# Patient Record
Sex: Female | Born: 1975 | Race: White | Hispanic: No | Marital: Married | Smoking: Never smoker
Health system: Southern US, Community
[De-identification: ages and names within clinical notes are randomized; demographics above are authoritative.]

## PROBLEM LIST (undated history)

## (undated) ENCOUNTER — Inpatient Hospital Stay (HOSPITAL_COMMUNITY): Payer: Self-pay

## (undated) DIAGNOSIS — O24419 Gestational diabetes mellitus in pregnancy, unspecified control: Secondary | ICD-10-CM

## (undated) HISTORY — DX: Gestational diabetes mellitus in pregnancy, unspecified control: O24.419

---

## 2012-10-05 ENCOUNTER — Other Ambulatory Visit: Payer: Self-pay

## 2012-10-06 ENCOUNTER — Encounter (HOSPITAL_COMMUNITY): Payer: Self-pay | Admitting: Obstetrics and Gynecology

## 2012-10-08 ENCOUNTER — Other Ambulatory Visit (HOSPITAL_COMMUNITY): Payer: Self-pay | Admitting: Obstetrics and Gynecology

## 2012-10-08 DIAGNOSIS — O09529 Supervision of elderly multigravida, unspecified trimester: Secondary | ICD-10-CM

## 2012-10-08 DIAGNOSIS — O30039 Twin pregnancy, monochorionic/diamniotic, unspecified trimester: Secondary | ICD-10-CM

## 2012-10-12 ENCOUNTER — Other Ambulatory Visit: Payer: Self-pay

## 2012-10-12 ENCOUNTER — Ambulatory Visit (HOSPITAL_COMMUNITY)
Admission: RE | Admit: 2012-10-12 | Discharge: 2012-10-12 | Disposition: A | Discharge status: Home / Self Care | Payer: BC Managed Care – PPO | Source: Ambulatory Visit | Attending: Obstetrics and Gynecology | Admitting: Obstetrics and Gynecology

## 2012-10-12 ENCOUNTER — Encounter (HOSPITAL_COMMUNITY): Payer: Self-pay

## 2012-10-12 ENCOUNTER — Other Ambulatory Visit (HOSPITAL_COMMUNITY): Payer: Self-pay | Admitting: Obstetrics and Gynecology

## 2012-10-12 ENCOUNTER — Other Ambulatory Visit (HOSPITAL_COMMUNITY): Payer: Self-pay

## 2012-10-12 VITALS — BP 104/60 | HR 106 | Wt 175.0 lb

## 2012-10-12 DIAGNOSIS — O09529 Supervision of elderly multigravida, unspecified trimester: Secondary | ICD-10-CM

## 2012-10-12 DIAGNOSIS — O30039 Twin pregnancy, monochorionic/diamniotic, unspecified trimester: Secondary | ICD-10-CM

## 2012-10-12 DIAGNOSIS — O30009 Twin pregnancy, unspecified number of placenta and unspecified number of amniotic sacs, unspecified trimester: Secondary | ICD-10-CM | POA: Insufficient documentation

## 2012-10-12 NOTE — Progress Notes (Signed)
Genetic Counseling  High-Risk Gestation Note  Appointment Date:  10/12/2012 Referred By: Levi Aland, MD Date of Birth:  1975/04/22 Partner:  Reola Mosher   Pregnancy History: [id number] Estimated Date of Delivery: 04/26/13 Estimated Gestational Age: [redacted]w[redacted]d Attending: Alpha Gula, MD  Ms. [name] Green and her partner, Mr. [name] Green, were seen for genetic counseling because of a maternal age of 37 y.o.Marland Kitchen  The patient will be 37 y.o. at delivery.   They were counseled regarding maternal age and the association with risk for chromosome conditions due to nondisjunction with aging of the ova.  We reviewed chromosomes, nondisjunction, and the associated 1 in 49 risk for fetal aneuploidy at [redacted]w[redacted]d gestation related to a maternal age of 37 y.o. at delivery.  She was counseled that the risk for aneuploidy decreases as gestational age increases, accounting for those pregnancies which spontaneously abort.  We specifically discussed Down syndrome (trisomy 38), trisomies 38 and 38, and sex chromosome aneuploidies (47,XXX and 47,XXY) including the common features and prognoses of each.   We reviewed available screening options including First Screen, Quad screen, noninvasive prenatal screening (NIPS)/cell free fetal DNA (cffDNA) testing, and detailed ultrasound.  They were counseled that screening tests are used to modify a patient's a priori risk for aneuploidy, typically based on age.  This estimate provides a pregnancy specific risk assessment. We reviewed the benefits and limitations of each option.  We reviewed that the detection rates of these screens are lower in a twin gestation.  Specifically, we discussed the conditions for which each test screens, the detection rates, and false positive rates of each. They were also counseled regarding diagnostic testing via CVS and amniocentesis. We reviewed the approximate 1 in 100 risk for complications for CVS and the approximate 1 in 300-500 risk for  complications for amniocentesis, including spontaneous pregnancy loss. After consideration of all the options, they elected to proceed with NIPS ([facility]) at the time of today's visit.  Those results will be available in 8-10 days.  The couple declined diagnostic testing (CVS and amniocentesis) in the pregnancy.   They also expressed interest in pursuing a nuchal translucency ultrasound, which was performed today. The report will be documented separately. Given the presence of monochorionic/diamniotic twins, a follow-up ultrasound was recommended at 16 weeks. Additionally, the patient would like to return for a detailed ultrasound at ~18+ weeks gestation.  Follow-up appointments were made for 11/09/12 and 11/23/12. They understand that screening tests cannot rule out all birth defects or genetic syndromes. The patient was advised of this limitation and states she still does not want additional testing at this time.    Ms. [name] Green was provided with written information regarding cystic fibrosis (CF) including the carrier frequency and incidence in the Caucasian population, the availability of carrier testing and prenatal diagnosis if indicated.  In addition, we discussed that CF is routinely screened for as part of the [place] newborn screening panel.  She declined testing today.   Both family histories were reviewed and found to be contributory for cerebral palsy on the left side of the body for the father of the pregnancy. He reported that it is very mild and that he was suspected to have had a burst blood vessel or stroke in utero. Additionally, he reported a history of one seizure when he was in the 8th grade. This was attributed to scar tissue in his brain and a growth spurt at that time. He reported no additional seizures throughout his lifetime. We discussed that  cerebral palsy is not thought to have a genetic component. Thus, this would not be expected to affect the couple's chances to have a baby with  cerebral palsy, especially when a cause is suspected and the diagnosis is correct. Epilepsy occurs in approximately 1% of the population and can have many causes.  Approximately 80% of epilepsy is thought to be idiopathic while the remaining 20% is secondary to a variety of factors such as perinatal events, infections, trauma and genetic disease.  A specific diagnosis in an affected individual is necessary to accurately assess the risk for other family members to develop epilepsy.  In the absence of a known etiology, epilepsy is thought to be caused by a combination of genetic and environmental factors, called multifactorial inheritance. Recurrence risk for epilepsy is estimated to be 4% for offspring of an individual with primary idiopathic epilepsy. We discussed that given that the cause of his seizure is suspected, recurrence risk would likely be low for his offspring. Without further information regarding the provided family history, an accurate genetic risk cannot be calculated. Further genetic counseling is warranted if more information is obtained.  Ms. [name] Green denied exposure to environmental toxins or chemical agents. She denied the use of tobacco or street drugs. She reported drinking alcohol until 6 weeks of pregnancy, at which point she discovered the pregnancy. She reported no alcohol since that time. The all-or-none period was discussed, meaning exposures that occur in the first 4 weeks of gestation are typically thought to either not affect the pregnancy at all or result in a miscarriage.  Prenatal alcohol exposure can increase the risk for growth delays, small head size, heart defects, eye and facial differences, as well as behavior problems and learning disabilities. The risk of these to occur tends to increase with the amount of alcohol consumed. However, because there is no identified safe amount of alcohol in pregnancy, it is recommended to completely avoid alcohol in pregnancy. Given the  reported amount of exposure, risk for associated effects are likely low in the current pregnancy. She denied significant viral illnesses during the course of her pregnancy. Her medical and surgical histories were noncontributory.   I counseled this couple regarding the above risks and available options. The approximate face-to-face time with the genetic counselor was 40 minutes.  Quinn Plowman, MS,  Certified [employer] 10/12/2012

## 2012-10-12 NOTE — Progress Notes (Signed)
[name] Green  was seen today for an ultrasound appointment.  See full report in [facility].  Impression: MC/DA twin gestation with best dates of 12 0/7 weeks A thin dividing membrane was noted  NT x 2 were within normal limits  After counseling, the patient elected to undergo NIPS (cell free fetal DNA)  Recommendations: Recommend follow-up ultrasound examination at 16 weeks to begin screening for TTTS. Detailed anatomy at 18 weeks with follow up every 2 weeks thereafter Fetal echos at approximately 22 weeks due to MC/DA placentation.  Alpha Gula, MD

## 2012-10-15 ENCOUNTER — Other Ambulatory Visit (HOSPITAL_COMMUNITY): Payer: Self-pay

## 2012-10-15 ENCOUNTER — Encounter (HOSPITAL_COMMUNITY): Payer: Self-pay

## 2012-10-22 ENCOUNTER — Other Ambulatory Visit (HOSPITAL_COMMUNITY): Payer: Self-pay | Admitting: Maternal and Fetal Medicine

## 2012-10-22 ENCOUNTER — Telehealth (HOSPITAL_COMMUNITY): Payer: Self-pay | Admitting: MS"

## 2012-10-22 DIAGNOSIS — O30039 Twin pregnancy, monochorionic/diamniotic, unspecified trimester: Secondary | ICD-10-CM

## 2012-10-22 DIAGNOSIS — O09529 Supervision of elderly multigravida, unspecified trimester: Secondary | ICD-10-CM

## 2012-10-22 NOTE — Telephone Encounter (Signed)
Called [name] Green to discuss her cell free fetal DNA test results.  Mrs. [name] Green had [facility] testing through [facility].  Testing was offered because of advanced maternal age.   The patient was identified by name and DOB.  We reviewed that these are within normal limits, showing a less than 1 in 10,000 risk for trisomies 21, 18 and 13.  Given that the current pregnancy is a twin gestation, analysis for X and Y was not available.  We reviewed that this testing identifies > 99% of pregnancies with trisomy 21, >98% of pregnancies with trisomy 64, and approximately 80% of pregnancies with trisomy 38.  The detection rate is reportedly decreased in a twin gestation.  The false positive rate is <0.1% for all conditions. She understands that this testing does not identify all genetic conditions.  All questions were answered to her satisfaction, she was encouraged to call with additional questions or concerns.  We reviewed upcoming ultrasound appointment dates with Ms. [name] Green. The patient stated that she has an ultrasound scheduled for 8/5. Upon further review, this ultrasound was scheduled but was cancelled and rescheduled for 11/09/12. I stated that we can put the originally scheduled ultrasound back for 8/5, since the patient was not aware that this was cancelled. However, upon further review, the recommendations at the time of the patient's last visit were to do an ultrasound at [redacted] weeks gestation to assess amniotic fluid index and detailed anatomy ultrasound at 18 weeks. I explained to the patient that she will be 16 weeks on 11/09/12, and that must be why the 10/26/12 ultrasound was cancelled, given that she will be too early at that time. Ms. [name] Green agreed to cancel the 10/26/12 ultrasound and is planning to return to our office on 11/09/12 and 11/23/12 for ultrasounds.   Quinn Plowman, MS Certified Genetic Counselor 10/22/2012 4:12 PM

## 2012-10-26 ENCOUNTER — Ambulatory Visit (HOSPITAL_COMMUNITY): Payer: BC Managed Care – PPO

## 2012-11-09 ENCOUNTER — Ambulatory Visit (HOSPITAL_COMMUNITY)
Admission: RE | Admit: 2012-11-09 | Discharge: 2012-11-09 | Disposition: A | Discharge status: Home / Self Care | Payer: BC Managed Care – PPO | Source: Ambulatory Visit | Attending: Obstetrics and Gynecology | Admitting: Obstetrics and Gynecology

## 2012-11-09 DIAGNOSIS — O30039 Twin pregnancy, monochorionic/diamniotic, unspecified trimester: Secondary | ICD-10-CM

## 2012-11-09 DIAGNOSIS — O09529 Supervision of elderly multigravida, unspecified trimester: Secondary | ICD-10-CM

## 2012-11-09 DIAGNOSIS — O30009 Twin pregnancy, unspecified number of placenta and unspecified number of amniotic sacs, unspecified trimester: Secondary | ICD-10-CM | POA: Insufficient documentation

## 2012-11-23 ENCOUNTER — Encounter (HOSPITAL_COMMUNITY): Payer: Self-pay

## 2012-11-23 ENCOUNTER — Ambulatory Visit (HOSPITAL_COMMUNITY)
Admission: RE | Admit: 2012-11-23 | Discharge: 2012-11-23 | Disposition: A | Discharge status: Home / Self Care | Payer: BC Managed Care – PPO | Source: Ambulatory Visit | Attending: Obstetrics and Gynecology | Admitting: Obstetrics and Gynecology

## 2012-11-23 DIAGNOSIS — O30009 Twin pregnancy, unspecified number of placenta and unspecified number of amniotic sacs, unspecified trimester: Secondary | ICD-10-CM | POA: Insufficient documentation

## 2012-11-23 DIAGNOSIS — O30039 Twin pregnancy, monochorionic/diamniotic, unspecified trimester: Secondary | ICD-10-CM

## 2012-11-23 DIAGNOSIS — O09529 Supervision of elderly multigravida, unspecified trimester: Secondary | ICD-10-CM | POA: Insufficient documentation

## 2012-11-23 NOTE — Progress Notes (Signed)
Ms. [name] Green was seen for ultrasound appointment today.  Please see [facility] report for details.

## 2012-12-07 ENCOUNTER — Other Ambulatory Visit (HOSPITAL_COMMUNITY): Payer: Self-pay | Admitting: Obstetrics and Gynecology

## 2012-12-07 DIAGNOSIS — O30009 Twin pregnancy, unspecified number of placenta and unspecified number of amniotic sacs, unspecified trimester: Secondary | ICD-10-CM

## 2012-12-07 DIAGNOSIS — Z0489 Encounter for examination and observation for other specified reasons: Secondary | ICD-10-CM

## 2012-12-08 ENCOUNTER — Other Ambulatory Visit: Payer: Self-pay | Admitting: Family Medicine

## 2012-12-08 ENCOUNTER — Ambulatory Visit (HOSPITAL_COMMUNITY)
Admission: RE | Admit: 2012-12-08 | Discharge: 2012-12-08 | Disposition: A | Discharge status: Home / Self Care | Payer: BC Managed Care – PPO | Source: Ambulatory Visit | Attending: Obstetrics and Gynecology | Admitting: Obstetrics and Gynecology

## 2012-12-08 VITALS — BP 127/73 | HR 102 | Wt 185.0 lb

## 2012-12-08 DIAGNOSIS — O30009 Twin pregnancy, unspecified number of placenta and unspecified number of amniotic sacs, unspecified trimester: Secondary | ICD-10-CM | POA: Insufficient documentation

## 2012-12-08 DIAGNOSIS — O30039 Twin pregnancy, monochorionic/diamniotic, unspecified trimester: Secondary | ICD-10-CM | POA: Insufficient documentation

## 2012-12-08 DIAGNOSIS — Z0489 Encounter for examination and observation for other specified reasons: Secondary | ICD-10-CM

## 2012-12-08 DIAGNOSIS — O09529 Supervision of elderly multigravida, unspecified trimester: Secondary | ICD-10-CM | POA: Insufficient documentation

## 2012-12-08 NOTE — Progress Notes (Signed)
[name] Green  was seen today for an ultrasound appointment.  See full report in [facility].  Impression: MC/DA twin gestation with best dates of 20 1/7 weeks Normal amniotic fluid volume x 2 noted. Bladders visible in both twins  Twin A - Normal interval anatomy; 4CH and outflow tracts well-visualized  Twin B- Bilateral, mild renal pylectasis is again noted.  Limited views of the fetal heart obtained due to fetal position.  Recommendations: Recommend follow up in weeks for interval growth; 4 weeks for amniotic fluid assessment (TTTS screen)  Alpha Gula, MD

## 2012-12-22 ENCOUNTER — Ambulatory Visit (HOSPITAL_COMMUNITY)
Admission: RE | Admit: 2012-12-22 | Discharge: 2012-12-22 | Disposition: A | Discharge status: Home / Self Care | Payer: BC Managed Care – PPO | Source: Ambulatory Visit | Attending: Obstetrics and Gynecology | Admitting: Obstetrics and Gynecology

## 2012-12-22 DIAGNOSIS — Z0489 Encounter for examination and observation for other specified reasons: Secondary | ICD-10-CM

## 2012-12-22 DIAGNOSIS — O09529 Supervision of elderly multigravida, unspecified trimester: Secondary | ICD-10-CM | POA: Insufficient documentation

## 2012-12-22 DIAGNOSIS — O358XX0 Maternal care for other (suspected) fetal abnormality and damage, not applicable or unspecified: Secondary | ICD-10-CM | POA: Insufficient documentation

## 2012-12-22 DIAGNOSIS — O30009 Twin pregnancy, unspecified number of placenta and unspecified number of amniotic sacs, unspecified trimester: Secondary | ICD-10-CM

## 2012-12-22 DIAGNOSIS — Z363 Encounter for antenatal screening for malformations: Secondary | ICD-10-CM | POA: Insufficient documentation

## 2012-12-22 DIAGNOSIS — Z1389 Encounter for screening for other disorder: Secondary | ICD-10-CM | POA: Insufficient documentation

## 2012-12-22 NOTE — Progress Notes (Signed)
[name] Green  was seen today for an ultrasound appointment.  See full report in [facility].  Impression: MC/DA twin gestation with best dates of 22 1/7 weeks Interval growth is concordant  Twin A - Maternal right, cephalic, posterior placenta Normal interval anatomy.   Fetal growhth is appropriate (60th %tile) Normal amniotic fluid volume.  Twin B- Maternal left, cephalic, posterior placenta Mild right renal pylectasis noted (4 mm) Interval anatomy otherwise within normal limits.  Some heart views not seen (RVOT, LVOT, ductal arch) Growth is appropriate (55th %tile) Normal amniotic fluid volume  Recommendations: Follow up in 2 weeks to screen for TTTS. Recommend follow-up ultrasound examination in 4 weeks for growth. Fetal echo due to MC/DA placentation. (will schedule at next visit it not already done)  Alpha Gula, MD

## 2013-01-04 ENCOUNTER — Other Ambulatory Visit (HOSPITAL_COMMUNITY): Payer: Self-pay | Admitting: Obstetrics and Gynecology

## 2013-01-04 DIAGNOSIS — O09529 Supervision of elderly multigravida, unspecified trimester: Secondary | ICD-10-CM

## 2013-01-04 DIAGNOSIS — O358XX0 Maternal care for other (suspected) fetal abnormality and damage, not applicable or unspecified: Secondary | ICD-10-CM

## 2013-01-04 DIAGNOSIS — O30009 Twin pregnancy, unspecified number of placenta and unspecified number of amniotic sacs, unspecified trimester: Secondary | ICD-10-CM

## 2013-01-05 ENCOUNTER — Other Ambulatory Visit (HOSPITAL_COMMUNITY): Payer: Self-pay | Admitting: Obstetrics and Gynecology

## 2013-01-05 ENCOUNTER — Ambulatory Visit (HOSPITAL_COMMUNITY)
Admission: RE | Admit: 2013-01-05 | Discharge: 2013-01-05 | Disposition: A | Discharge status: Home / Self Care | Payer: BC Managed Care – PPO | Source: Ambulatory Visit | Attending: Obstetrics and Gynecology | Admitting: Obstetrics and Gynecology

## 2013-01-05 VITALS — BP 122/70 | HR 95 | Wt 186.0 lb

## 2013-01-05 DIAGNOSIS — O09529 Supervision of elderly multigravida, unspecified trimester: Secondary | ICD-10-CM | POA: Insufficient documentation

## 2013-01-05 DIAGNOSIS — O358XX0 Maternal care for other (suspected) fetal abnormality and damage, not applicable or unspecified: Secondary | ICD-10-CM

## 2013-01-05 DIAGNOSIS — O30009 Twin pregnancy, unspecified number of placenta and unspecified number of amniotic sacs, unspecified trimester: Secondary | ICD-10-CM

## 2013-01-05 NOTE — Progress Notes (Signed)
Ms. [name] Green was seen for ultrasound appointment today.  Please see [facility] report for details.

## 2013-01-16 ENCOUNTER — Inpatient Hospital Stay (HOSPITAL_COMMUNITY): Payer: BC Managed Care – PPO

## 2013-01-16 ENCOUNTER — Inpatient Hospital Stay (HOSPITAL_COMMUNITY): Payer: BC Managed Care – PPO | Admitting: Anesthesiology

## 2013-01-16 ENCOUNTER — Encounter (HOSPITAL_COMMUNITY): Payer: Self-pay | Admitting: *Deleted

## 2013-01-16 ENCOUNTER — Inpatient Hospital Stay (HOSPITAL_COMMUNITY)
Admission: AD | Admit: 2013-01-16 | Discharge: 2013-01-20 | DRG: 765 | Disposition: A | Discharge status: Home / Self Care | Payer: BC Managed Care – PPO | Source: Ambulatory Visit | Attending: Obstetrics & Gynecology | Admitting: Obstetrics & Gynecology

## 2013-01-16 ENCOUNTER — Encounter (HOSPITAL_COMMUNITY): Payer: BC Managed Care – PPO | Admitting: Anesthesiology

## 2013-01-16 ENCOUNTER — Encounter (HOSPITAL_COMMUNITY)
Admission: AD | Disposition: A | Discharge status: Home / Self Care | Payer: Self-pay | Source: Ambulatory Visit | Attending: Obstetrics & Gynecology

## 2013-01-16 DIAGNOSIS — O329XX0 Maternal care for malpresentation of fetus, unspecified, not applicable or unspecified: Secondary | ICD-10-CM

## 2013-01-16 DIAGNOSIS — O2390 Unspecified genitourinary tract infection in pregnancy, unspecified trimester: Secondary | ICD-10-CM

## 2013-01-16 DIAGNOSIS — O3090 Multiple gestation, unspecified, unspecified trimester: Principal | ICD-10-CM | POA: Diagnosis present

## 2013-01-16 DIAGNOSIS — B9689 Other specified bacterial agents as the cause of diseases classified elsewhere: Secondary | ICD-10-CM | POA: Diagnosis present

## 2013-01-16 DIAGNOSIS — N760 Acute vaginitis: Secondary | ICD-10-CM | POA: Diagnosis present

## 2013-01-16 DIAGNOSIS — A499 Bacterial infection, unspecified: Secondary | ICD-10-CM | POA: Diagnosis present

## 2013-01-16 DIAGNOSIS — O30039 Twin pregnancy, monochorionic/diamniotic, unspecified trimester: Secondary | ICD-10-CM

## 2013-01-16 DIAGNOSIS — O30009 Twin pregnancy, unspecified number of placenta and unspecified number of amniotic sacs, unspecified trimester: Secondary | ICD-10-CM | POA: Diagnosis present

## 2013-01-16 LAB — CBC
Hemoglobin: 12.2 g/dL (ref 12.0–15.0)
MCH: 33 pg (ref 26.0–34.0)
MCHC: 34.5 g/dL (ref 30.0–36.0)
MCV: 95.7 fL (ref 78.0–100.0)
Platelets: 224 10*3/uL (ref 150–400)
RBC: 3.7 MIL/uL — ABNORMAL LOW (ref 3.87–5.11)

## 2013-01-16 LAB — ABO/RH: ABO/RH(D): A POS

## 2013-01-16 LAB — RPR: RPR Ser Ql: NONREACTIVE

## 2013-01-16 LAB — TYPE AND SCREEN: Antibody Screen: NEGATIVE

## 2013-01-16 SURGERY — Surgical Case
Anesthesia: General | Wound class: Clean Contaminated

## 2013-01-16 MED ORDER — SIMETHICONE 80 MG PO CHEW
80.0000 mg | CHEWABLE_TABLET | Freq: Three times a day (TID) | ORAL | Status: DC
  Administered 2013-01-17 – 2013-01-19 (×7): 80 mg via ORAL
  Filled 2013-01-16 (×10): qty 1

## 2013-01-16 MED ORDER — MAGNESIUM SULFATE 40 G IN LACTATED RINGERS - SIMPLE
2.0000 g/h | INTRAVENOUS | Status: DC
  Filled 2013-01-16: qty 500

## 2013-01-16 MED ORDER — HYDROMORPHONE HCL PF 1 MG/ML IJ SOLN
0.2500 mg | INTRAMUSCULAR | Status: DC | PRN
  Administered 2013-01-16 (×4): 0.5 mg via INTRAVENOUS

## 2013-01-16 MED ORDER — LACTATED RINGERS IV SOLN
500.0000 mL | INTRAVENOUS | Status: DC | PRN
  Administered 2013-01-16: 999 mL via INTRAVENOUS

## 2013-01-16 MED ORDER — ZOLPIDEM TARTRATE 5 MG PO TABS: 5.0000 mg | ORAL_TABLET | Freq: Every evening | ORAL | Status: DC | PRN

## 2013-01-16 MED ORDER — DIPHENHYDRAMINE HCL 12.5 MG/5ML PO ELIX
12.5000 mg | ORAL_SOLUTION | Freq: Four times a day (QID) | ORAL | Status: DC | PRN
  Filled 2013-01-16: qty 5

## 2013-01-16 MED ORDER — OXYTOCIN 10 UNIT/ML IJ SOLN
40.0000 [IU] | INTRAVENOUS | Status: DC | PRN
  Administered 2013-01-16: 40 [IU] via INTRAVENOUS

## 2013-01-16 MED ORDER — HYDROMORPHONE HCL PF 1 MG/ML IJ SOLN
INTRAMUSCULAR | Status: AC
  Filled 2013-01-16: qty 1

## 2013-01-16 MED ORDER — BETAMETHASONE SOD PHOS & ACET 6 (3-3) MG/ML IJ SUSP
12.0000 mg | Freq: Once | INTRAMUSCULAR | Status: DC
  Filled 2013-01-16: qty 2

## 2013-01-16 MED ORDER — OXYCODONE-ACETAMINOPHEN 5-325 MG PO TABS
1.0000 | ORAL_TABLET | ORAL | Status: DC | PRN
  Administered 2013-01-17 (×3): 2 via ORAL
  Administered 2013-01-17 – 2013-01-18 (×3): 1 via ORAL
  Administered 2013-01-18: 2 via ORAL
  Administered 2013-01-18: 1 via ORAL
  Administered 2013-01-19 (×2): 2 via ORAL
  Filled 2013-01-16: qty 2
  Filled 2013-01-16: qty 1
  Filled 2013-01-16 (×3): qty 2
  Filled 2013-01-16: qty 1
  Filled 2013-01-16: qty 2
  Filled 2013-01-16: qty 1
  Filled 2013-01-16: qty 2
  Filled 2013-01-16: qty 1

## 2013-01-16 MED ORDER — SENNOSIDES-DOCUSATE SODIUM 8.6-50 MG PO TABS
2.0000 | ORAL_TABLET | ORAL | Status: DC
  Administered 2013-01-17 – 2013-01-20 (×3): 2 via ORAL
  Filled 2013-01-16 (×3): qty 2

## 2013-01-16 MED ORDER — WITCH HAZEL-GLYCERIN EX PADS: 1.0000 "application " | MEDICATED_PAD | CUTANEOUS | Status: DC | PRN

## 2013-01-16 MED ORDER — MIDAZOLAM HCL 2 MG/2ML IJ SOLN
INTRAMUSCULAR | Status: DC | PRN
  Administered 2013-01-16: 2 mg via INTRAVENOUS

## 2013-01-16 MED ORDER — LIDOCAINE HCL (PF) 1 % IJ SOLN: 30.0000 mL | INTRAMUSCULAR | Status: DC | PRN

## 2013-01-16 MED ORDER — ONDANSETRON HCL 4 MG/2ML IJ SOLN
INTRAMUSCULAR | Status: DC | PRN
  Administered 2013-01-16: 4 mg via INTRAVENOUS

## 2013-01-16 MED ORDER — IBUPROFEN 600 MG PO TABS: 600.0000 mg | ORAL_TABLET | Freq: Four times a day (QID) | ORAL | Status: DC | PRN

## 2013-01-16 MED ORDER — HYDROMORPHONE HCL PF 1 MG/ML IJ SOLN
INTRAMUSCULAR | Status: AC
  Administered 2013-01-16: 0.5 mg via INTRAVENOUS
  Filled 2013-01-16: qty 1

## 2013-01-16 MED ORDER — SUCCINYLCHOLINE CHLORIDE 20 MG/ML IJ SOLN
INTRAMUSCULAR | Status: DC | PRN
  Administered 2013-01-16: 200 mg via INTRAVENOUS

## 2013-01-16 MED ORDER — ACETAMINOPHEN 325 MG PO TABS: 650.0000 mg | ORAL_TABLET | ORAL | Status: DC | PRN

## 2013-01-16 MED ORDER — ONDANSETRON HCL 4 MG PO TABS: 4.0000 mg | ORAL_TABLET | ORAL | Status: DC | PRN

## 2013-01-16 MED ORDER — MIDAZOLAM HCL 2 MG/2ML IJ SOLN
INTRAMUSCULAR | Status: AC
  Filled 2013-01-16: qty 2

## 2013-01-16 MED ORDER — IBUPROFEN 600 MG PO TABS
600.0000 mg | ORAL_TABLET | Freq: Four times a day (QID) | ORAL | Status: DC
  Administered 2013-01-17 – 2013-01-20 (×13): 600 mg via ORAL
  Filled 2013-01-16 (×13): qty 1

## 2013-01-16 MED ORDER — ONDANSETRON HCL 4 MG/2ML IJ SOLN
4.0000 mg | Freq: Four times a day (QID) | INTRAMUSCULAR | Status: DC | PRN
  Filled 2013-01-16: qty 2

## 2013-01-16 MED ORDER — LACTATED RINGERS IV SOLN
INTRAVENOUS | Status: DC
  Administered 2013-01-16: 17:00:00 via INTRAVENOUS

## 2013-01-16 MED ORDER — HYDROMORPHONE HCL PF 1 MG/ML IJ SOLN
INTRAMUSCULAR | Status: DC | PRN
  Administered 2013-01-16: 1 mg via INTRAVENOUS

## 2013-01-16 MED ORDER — FENTANYL CITRATE 0.05 MG/ML IJ SOLN: INTRAMUSCULAR | Status: DC | PRN

## 2013-01-16 MED ORDER — NALOXONE HCL 0.4 MG/ML IJ SOLN: 0.4000 mg | INTRAMUSCULAR | Status: DC | PRN

## 2013-01-16 MED ORDER — PROPOFOL 10 MG/ML IV EMUL
INTRAVENOUS | Status: AC
  Filled 2013-01-16: qty 20

## 2013-01-16 MED ORDER — HYDROMORPHONE 0.3 MG/ML IV SOLN
INTRAVENOUS | Status: DC
  Administered 2013-01-16: 22:00:00 via INTRAVENOUS
  Administered 2013-01-17: 1.92 mg via INTRAVENOUS
  Administered 2013-01-17: 2.4 mg via INTRAVENOUS
  Administered 2013-01-17: 2.7 mg via INTRAVENOUS
  Filled 2013-01-16: qty 25

## 2013-01-16 MED ORDER — MENTHOL 3 MG MT LOZG: 1.0000 | LOZENGE | OROMUCOSAL | Status: DC | PRN

## 2013-01-16 MED ORDER — LACTATED RINGERS IV SOLN: INTRAVENOUS | Status: DC

## 2013-01-16 MED ORDER — OXYTOCIN 40 UNITS IN LACTATED RINGERS INFUSION - SIMPLE MED: 62.5000 mL/h | INTRAVENOUS | Status: AC

## 2013-01-16 MED ORDER — OXYCODONE-ACETAMINOPHEN 5-325 MG PO TABS: 1.0000 | ORAL_TABLET | ORAL | Status: DC | PRN

## 2013-01-16 MED ORDER — DIPHENHYDRAMINE HCL 50 MG/ML IJ SOLN: 12.5000 mg | Freq: Four times a day (QID) | INTRAMUSCULAR | Status: DC | PRN

## 2013-01-16 MED ORDER — SUCCINYLCHOLINE CHLORIDE 20 MG/ML IJ SOLN
INTRAMUSCULAR | Status: AC
  Filled 2013-01-16: qty 10

## 2013-01-16 MED ORDER — ONDANSETRON HCL 4 MG/2ML IJ SOLN: 4.0000 mg | Freq: Four times a day (QID) | INTRAMUSCULAR | Status: DC | PRN

## 2013-01-16 MED ORDER — TERBUTALINE SULFATE 1 MG/ML IJ SOLN
INTRAMUSCULAR | Status: AC
  Filled 2013-01-16: qty 1

## 2013-01-16 MED ORDER — MAGNESIUM SULFATE BOLUS VIA INFUSION
6.0000 g | Freq: Once | INTRAVENOUS | Status: DC
  Filled 2013-01-16: qty 500

## 2013-01-16 MED ORDER — FENTANYL CITRATE 0.05 MG/ML IJ SOLN
INTRAMUSCULAR | Status: AC
  Filled 2013-01-16: qty 5

## 2013-01-16 MED ORDER — ONDANSETRON HCL 4 MG/2ML IJ SOLN
INTRAMUSCULAR | Status: AC
  Filled 2013-01-16: qty 2

## 2013-01-16 MED ORDER — LANOLIN HYDROUS EX OINT: 1.0000 "application " | TOPICAL_OINTMENT | CUTANEOUS | Status: DC | PRN

## 2013-01-16 MED ORDER — PRENATAL MULTIVITAMIN CH
1.0000 | ORAL_TABLET | Freq: Every day | ORAL | Status: DC
  Administered 2013-01-17 – 2013-01-20 (×4): 1 via ORAL
  Filled 2013-01-16 (×4): qty 1

## 2013-01-16 MED ORDER — ONDANSETRON 4 MG PO TBDP: 4.0000 mg | ORAL_TABLET | Freq: Once | ORAL | Status: DC

## 2013-01-16 MED ORDER — OXYTOCIN 10 UNIT/ML IJ SOLN
INTRAMUSCULAR | Status: AC
  Filled 2013-01-16: qty 1

## 2013-01-16 MED ORDER — SODIUM CHLORIDE 0.9 % IV SOLN
2.0000 g | Freq: Once | INTRAVENOUS | Status: AC
  Administered 2013-01-16: 2 g via INTRAVENOUS
  Filled 2013-01-16: qty 2000

## 2013-01-16 MED ORDER — OXYTOCIN 40 UNITS IN LACTATED RINGERS INFUSION - SIMPLE MED: 62.5000 mL/h | INTRAVENOUS | Status: DC

## 2013-01-16 MED ORDER — LACTATED RINGERS IV SOLN
INTRAVENOUS | Status: DC | PRN
  Administered 2013-01-16 (×2): via INTRAVENOUS

## 2013-01-16 MED ORDER — PROPOFOL 10 MG/ML IV BOLUS
INTRAVENOUS | Status: DC | PRN
  Administered 2013-01-16: 200 mg via INTRAVENOUS

## 2013-01-16 MED ORDER — BUTORPHANOL TARTRATE 1 MG/ML IJ SOLN: 1.0000 mg | INTRAMUSCULAR | Status: DC | PRN

## 2013-01-16 MED ORDER — FLEET ENEMA 7-19 GM/118ML RE ENEM: 1.0000 | ENEMA | RECTAL | Status: DC | PRN

## 2013-01-16 MED ORDER — OXYTOCIN 10 UNIT/ML IJ SOLN
INTRAMUSCULAR | Status: AC
  Filled 2013-01-16: qty 3

## 2013-01-16 MED ORDER — FENTANYL CITRATE 0.05 MG/ML IJ SOLN
INTRAMUSCULAR | Status: DC | PRN
  Administered 2013-01-16: 125 ug via INTRAVENOUS
  Administered 2013-01-16: 75 ug via INTRAVENOUS
  Administered 2013-01-16: 50 ug via INTRAVENOUS

## 2013-01-16 MED ORDER — DIBUCAINE 1 % RE OINT: 1.0000 "application " | TOPICAL_OINTMENT | RECTAL | Status: DC | PRN

## 2013-01-16 MED ORDER — ONDANSETRON HCL 4 MG/2ML IJ SOLN
4.0000 mg | INTRAMUSCULAR | Status: DC | PRN
  Administered 2013-01-16 – 2013-01-18 (×2): 4 mg via INTRAVENOUS

## 2013-01-16 MED ORDER — TETANUS-DIPHTH-ACELL PERTUSSIS 5-2.5-18.5 LF-MCG/0.5 IM SUSP
0.5000 mL | Freq: Once | INTRAMUSCULAR | Status: AC
  Administered 2013-01-18: 0.5 mL via INTRAMUSCULAR
  Filled 2013-01-16: qty 0.5

## 2013-01-16 MED ORDER — OXYTOCIN BOLUS FROM INFUSION: 500.0000 mL | INTRAVENOUS | Status: DC

## 2013-01-16 MED ORDER — SIMETHICONE 80 MG PO CHEW
80.0000 mg | CHEWABLE_TABLET | ORAL | Status: DC
  Administered 2013-01-17 – 2013-01-20 (×3): 80 mg via ORAL
  Filled 2013-01-16 (×3): qty 1

## 2013-01-16 MED ORDER — SIMETHICONE 80 MG PO CHEW: 80.0000 mg | CHEWABLE_TABLET | ORAL | Status: DC | PRN

## 2013-01-16 MED ORDER — HYDROMORPHONE 0.3 MG/ML IV SOLN: INTRAVENOUS | Status: DC

## 2013-01-16 MED ORDER — DIPHENHYDRAMINE HCL 25 MG PO CAPS: 25.0000 mg | ORAL_CAPSULE | Freq: Four times a day (QID) | ORAL | Status: DC | PRN

## 2013-01-16 MED ORDER — SODIUM CHLORIDE 0.9 % IJ SOLN: 9.0000 mL | INTRAMUSCULAR | Status: DC | PRN

## 2013-01-16 MED ORDER — CITRIC ACID-SODIUM CITRATE 334-500 MG/5ML PO SOLN: 30.0000 mL | ORAL | Status: DC | PRN

## 2013-01-16 SURGICAL SUPPLY — 33 items
BARRIER ADHS 3X4 INTERCEED (GAUZE/BANDAGES/DRESSINGS) IMPLANT
CLAMP CORD UMBIL (MISCELLANEOUS) IMPLANT
CLOTH BEACON ORANGE TIMEOUT ST (SAFETY) ×2 IMPLANT
CONTAINER PREFILL 10% NBF 15ML (MISCELLANEOUS) IMPLANT
DRAPE LG THREE QUARTER DISP (DRAPES) ×4 IMPLANT
DRSG OPSITE POSTOP 4X10 (GAUZE/BANDAGES/DRESSINGS) ×2 IMPLANT
DURAPREP 26ML APPLICATOR (WOUND CARE) IMPLANT
ELECT REM PT RETURN 9FT ADLT (ELECTROSURGICAL) ×2
ELECTRODE REM PT RTRN 9FT ADLT (ELECTROSURGICAL) ×1 IMPLANT
GLOVE BIO SURGEON STRL SZ 6.5 (GLOVE) ×2 IMPLANT
GOWN PREVENTION PLUS XLARGE (GOWN DISPOSABLE) ×4 IMPLANT
GOWN STRL REIN XL XLG (GOWN DISPOSABLE) ×4 IMPLANT
KIT ABG SYR 3ML LUER SLIP (SYRINGE) ×4 IMPLANT
NEEDLE HYPO 25X5/8 SAFETYGLIDE (NEEDLE) ×4 IMPLANT
NEEDLE SPNL 18GX3.5 QUINCKE PK (NEEDLE) ×2 IMPLANT
NS IRRIG 1000ML POUR BTL (IV SOLUTION) ×2 IMPLANT
PACK C SECTION WH (CUSTOM PROCEDURE TRAY) ×2 IMPLANT
PAD OB MATERNITY 4.3X12.25 (PERSONAL CARE ITEMS) ×2 IMPLANT
SUT PDS AB 0 CTX 60 (SUTURE) IMPLANT
SUT VIC AB 0 CT1 27 (SUTURE)
SUT VIC AB 0 CT1 27XBRD ANBCTR (SUTURE) IMPLANT
SUT VIC AB 0 CT1 36 (SUTURE) IMPLANT
SUT VIC AB 2-0 CT1 27 (SUTURE) ×1
SUT VIC AB 2-0 CT1 TAPERPNT 27 (SUTURE) ×1 IMPLANT
SUT VIC AB 2-0 CTX 36 (SUTURE) ×4 IMPLANT
SUT VIC AB 3-0 CT1 27 (SUTURE) ×1
SUT VIC AB 3-0 CT1 TAPERPNT 27 (SUTURE) ×1 IMPLANT
SUT VIC AB 3-0 SH 27 (SUTURE)
SUT VIC AB 3-0 SH 27X BRD (SUTURE) IMPLANT
SYR 30ML LL (SYRINGE) ×2 IMPLANT
TOWEL OR 17X24 6PK STRL BLUE (TOWEL DISPOSABLE) ×2 IMPLANT
TRAY FOLEY CATH 14FR (SET/KITS/TRAYS/PACK) ×2 IMPLANT
WATER STERILE IRR 1000ML POUR (IV SOLUTION) ×2 IMPLANT

## 2013-01-16 NOTE — Transfer of Care (Signed)
Immediate Anesthesia Transfer of Care Note  Patient: [name] Green  Procedure(s) Performed: Procedure(s): CESAREAN SECTION (N/A)  Patient Location: PACU  Anesthesia Type:General  Level of Consciousness: awake and alert   Airway & Oxygen Therapy: Patient Spontanous Breathing and Patient connected to nasal cannula oxygen  Post-op Assessment: Report given to PACU RN and Post -op Vital signs reviewed and stable  Post vital signs: Reviewed and stable  Complications: No apparent anesthesia complications

## 2013-01-16 NOTE — Progress Notes (Signed)
Baby A breech.  Decision to proceed with stat C/S.

## 2013-01-16 NOTE — Op Note (Addendum)
01/16/2013  6:01 PM  PATIENT:  [name] Green  37 y.o. female  PRE-OPERATIVE DIAGNOSIS:  25 1/[redacted] weeks EGA, breech/breech/PTL/SROM  POST-OPERATIVE DIAGNOSIS: same  PROCEDURE:  LOW VERTICAL PRIMARY CESAREAN SECTION  SURGEON:  Surgeon(s) and Role:    * Allie Bossier, MD - Primary  PHYSICIAN ASSISTANT:   ASSISTANTS: Ilda Mori, MD and Rulon Abide, MD   ANESTHESIA:   general  EBL:  Total I/O In: 1000 [I.V.:1000] Out: 600 [Urine:100; Blood:500]  BLOOD ADMINISTERED:none  DRAINS: none   LOCAL MEDICATIONS USED:  NONE  FINDINGS: living female infants, both with Apgars of 5 & 7.  Intact placenta. Normal pelvic anatomy.  SPECIMEN:  Placenta and cord blood  DISPOSITION OF SPECIMEN:  PATHOLOGY  COUNTS:  There was no count done  TOURNIQUET:  * No tourniquets in log *  DICTATION: .Dragon Dictation  PLAN OF CARE: Admit to inpatient   PATIENT DISPOSITION:  PACU - hemodynamically stable.   Delay start of Pharmacological VTE agent (>24hrs) due to surgical blood loss or risk of bleeding: not applicable  I was called to the patient's room by the midwife Deidre Poe and notified that the patient's cervix was fully dilated and her amniotic sac was bulging into the vagina.I was also notified that baby A was breech.I did a vaginal exam to confirm this. I was told that Dr. Arlyce Dice was en route. The OR, NICU, and anesthesia were all notified of my decision to do a STAT cesarean section. I discussed this with the patient and her husband who agreed with the decision. En route to the OR her amniotic sac ruptured and clear fluid was noted.  In the OR, her abdomen was quickly prepped with betadine and general anesthesia was given. A Foley catheter had been placed prior to this. A transverse incision was made approximately 2 cm above the symphysis pubis. The fascia was cut in a transverse fashion and the rectus muscles were pulled to each side with traction. The peritoneum was entered with hemostats. A  bladder blade was placed and a low vertical incision was made on the moderately well-developed lower uterine segment. Amniotomy revealed clear fluid. Baby A was delivered from a breech lie and then Baby B was delivered from a breech lie. The uterus was left in situ, and the placenta was removed with traction. It was sent to pathology. The uterine cavity was cleaned with a dry sponge. A bladder blade was placed. The hysterotomy incision was closed with 2 layers of 2-0 vicryl suture in a running locking fashion, the second layer imbricating the first. Excellent hemostasis was noted. The peritoneum and rectus muscles were closed together with a 2-0 vicryl suture in a running fashion. The fascia was closed with a 0 vicryl running suture. No defects were palpable. Irrigation of the subcutaneous tissue was done with normal saline. The subcuticular tissue was closed with 3-0 vicrly. Steri-strips were placed over the incision. Her Foley catheter drained clear urine throughout. An X-ray of her abdomen was done to assure that no foreign bodies were left inside as there was not time to do a proper count. She was extubated and taken to the recovery room in stable condition.

## 2013-01-16 NOTE — Anesthesia Preprocedure Evaluation (Signed)
Anesthesia Evaluation  Patient identified by MRN, date of birth, ID band Patient awake    Reviewed: Allergy & Precautions, H&P , Patient's Chart, lab work & pertinent test results, reviewed documented beta blocker date and time   Airway Mallampati: III TM Distance: >3 FB Neck ROM: full    Dental no notable dental hx.    Pulmonary  breath sounds clear to auscultation  Pulmonary exam normal       Cardiovascular Rhythm:regular Rate:Normal     Neuro/Psych    GI/Hepatic   Endo/Other    Renal/GU      Musculoskeletal   Abdominal   Peds  Hematology   Anesthesia Other Findings Patient arriving to OR when met by Anesth team.  Pt denies allergies, +hx of GA for knee surgery, no problems.. Estimated at 175lbs.  Water broke when patient moved to bed, plan GA RSI.  Reproductive/Obstetrics                           Anesthesia Physical Anesthesia Plan  ASA: II and emergent  Anesthesia Plan: General   Post-op Pain Management:    Induction: Intravenous, Rapid sequence and Cricoid pressure planned  Airway Management Planned: Oral ETT  Additional Equipment:   Intra-op Plan:   Post-operative Plan: Extubation in OR  Informed Consent: I have reviewed the patients History and Physical, chart, labs and discussed the procedure including the risks, benefits and alternatives for the proposed anesthesia with the patient or authorized representative who has indicated his/her understanding and acceptance.   Dental Advisory Given and Dental advisory given  Plan Discussed with: CRNA and Surgeon  Anesthesia Plan Comments: (  Discussed general anesthesia, including possible nausea, instrumentation of airway, sore throat,pulmonary aspiration, etc. I asked if the were any outstanding questions, or  concerns before we proceeded. )        Anesthesia Quick Evaluation

## 2013-01-16 NOTE — H&P (Signed)
37 y.o. G3P0020  Estimated Date of Delivery: 04/26/13 admitted at 25/[redacted] weeks gestation in advanced preterm labor.  Patient went to MAU with contractions and found to be 7 cm.  Orders written for Magnesium and Steroid protocol in the hope that labor could be arrested for 24 - 48 hours.  She progressed rapidly, however, and because baby A was Breech she was brought to the OR by the in house Ob, Dr. Nicholaus Bloom, for a STAT cesarean delivery.  Prenatal Transfer Tool  Maternal Diabetes: Too early for screen Genetic Screening: Declined Maternal Ultrasounds/Referrals: Mono/Di twins.  Referred to [facility] for anatomy scans and follow up. Fetal Ultrasounds or other Referrals:  None Maternal Substance Abuse:  No Significant Maternal Medications:  None Significant Maternal Lab Results: None Other Significant Pregnancy Complications:  Preterm labor and delivery.  HIV and Hep B screens pending.  Afebrile, VSS Heart and Lungs: No active disease Abdomen: soft, gravid, EFW AGA. Cervical exam:  7 cm on admission to MAU.  Complete at 1730.  Impression: Patient admitted in advanced preterm labor and delivered.   Plan:  Patient delivered by STAT C/S due to Breech/Breech presentation at complete dilatation.

## 2013-01-16 NOTE — OR Nursing (Signed)
1752 XRAY returned page, XRAY tech was made aware abdominal scan needed as prior incision count was not completed due to STAT C-Section.XRAY Tech was given the information that patient was under general anesthesia and physician was closing fascia. XRAY tech informed me she did not see an order. Order was entered, no return call from [employer], xray tech arrives at [employer]. At 1824 no answer from results of xray, paged xray tech and she stated she would contact the radiologist, I stressed the importance as patient was under general anesthesia and order was entered as STAT. At [facility] radiologist Dr Areatha Keas returns call with negative results. Dr Marice Potter was contacted by phone with results.

## 2013-01-16 NOTE — Anesthesia Postprocedure Evaluation (Signed)
  Anesthesia Post-op Note  Patient: [name] Green  Procedure(s) Performed: Procedure(s) with comments: PRIMARY CESAREAN SECTION (N/A) - Classical incision Patient is awake and responsive. Pain and nausea are reasonably well controlled. Vital signs are stable and clinically acceptable. Oxygen saturation is clinically acceptable. There are no apparent anesthetic complications at this time. Patient is ready for discharge.

## 2013-01-16 NOTE — Progress Notes (Signed)
Notified ultrasonographer thinks babies are breech.

## 2013-01-17 ENCOUNTER — Encounter (HOSPITAL_COMMUNITY): Payer: Self-pay | Admitting: *Deleted

## 2013-01-17 LAB — CBC
HCT: 29 % — ABNORMAL LOW (ref 36.0–46.0)
MCH: 33.1 pg (ref 26.0–34.0)
MCHC: 33.8 g/dL (ref 30.0–36.0)
MCV: 98 fL (ref 78.0–100.0)
Platelets: 167 10*3/uL (ref 150–400)
RBC: 2.96 MIL/uL — ABNORMAL LOW (ref 3.87–5.11)
RDW: 13.2 % (ref 11.5–15.5)
WBC: 13.3 10*3/uL — ABNORMAL HIGH (ref 4.0–10.5)

## 2013-01-17 LAB — HIV ANTIBODY (ROUTINE TESTING W REFLEX): HIV: NONREACTIVE

## 2013-01-17 MED ORDER — METRONIDAZOLE 500 MG PO TABS
500.0000 mg | ORAL_TABLET | Freq: Two times a day (BID) | ORAL | Status: DC
  Administered 2013-01-17 – 2013-01-19 (×4): 500 mg via ORAL
  Filled 2013-01-17 (×9): qty 1

## 2013-01-17 NOTE — Lactation Note (Signed)
This note was copied from the chart of [name] A Caci Green. Lactation Consultation Note    Follow up consult with this mom of twins, 25 6/[redacted] weeks gestation , now 18 hours post partum. I reviewed the use of a DEP and pumping frequency and duration with mom, and how to hand express.I reviewed the NICU booklet with mom. Mom was able to express 1-2 mls of colostrum, which made her and dad very pleased. Mom will rent a DEP, paper work given to mom.  Mom knows to call for questions/concerns.  Patient Name: [name] Green Today's Date: 01/17/2013 Reason for consult: Follow-up assessment;NICU baby;Multiple gestation   Maternal Data Formula Feeding for Exclusion: No (babies in NICU) Infant to breast within first hour of birth: No Breastfeeding delayed due to:: Infant status Has patient been taught Hand Expression?: Yes Does the patient have breastfeeding experience prior to this delivery?: No  Feeding    LATCH Score/Interventions                      Lactation Tools Discussed/Used Tools: Pump WIC Program: No (mom will rent a DEP and order one from her insurance) Initiated by:: bedside rn , at 8 hours post partum Date initiated:: 01/17/13   Consult Status Consult Status: Follow-up Date: 01/18/13 Follow-up type: In-patient    [name] Green 01/17/2013, 1:32 PM    

## 2013-01-17 NOTE — Plan of Care (Signed)
Problem: Phase I Progression Outcomes Goal: OOB as tolerated unless otherwise ordered Outcome: Progressing Walked to door and tolerated well

## 2013-01-17 NOTE — Progress Notes (Signed)
Post Op Day 1 Subjective: no complaints  Objective: Blood pressure 99/58, pulse 95, temperature 97.8 F (36.6 C), temperature source Oral, resp. rate 20, last menstrual period 07/20/2012, SpO2 97.00%, pumping breast milk.  Physical Exam:  General: alert Lochia: appropriate Uterine Fundus: firm Incision: no significant drainage   Recent Labs  01/16/13 1715 01/17/13 0540  HGB 12.2 9.8*  HCT 35.4* 29.0*    Assessment/Plan: Doing well.  Will continue observation.  Patient had symptomatic BV in office and I have ordered Flagyl to complete treatment.  Babies doing well in NICU.    LOS: 1 day   Ester Mabe D 01/17/2013, 9:49 AM

## 2013-01-17 NOTE — Plan of Care (Signed)
Problem: Consults Goal: Postpartum Patient Education (See Patient Education module for education specifics.) Outcome: Progressing Plan of care,PCA use and equipment discussed with patient and husband  Problem: Phase I Progression Outcomes Goal: Pain controlled with appropriate interventions Outcome: Completed/Met Date Met:  01/17/13 Good pain control on PCA Dilaudid Goal: Voiding adequately Outcome: Not Met (add Reason) Foley to s/d amber urine Goal: IS, TCDB as ordered Outcome: Completed/Met Date Met:  01/17/13 Patient can get I/S up to 2500 Goal: Initial discharge plan identified Outcome: Completed/Met Date Met:  01/17/13 VSS Pain control PP Checks WNL Bleeding WNL Understands self care

## 2013-01-17 NOTE — Progress Notes (Signed)
Ur chart review completed.  

## 2013-01-17 NOTE — Anesthesia Postprocedure Evaluation (Signed)
  Anesthesia Post-op Note  Patient: [name] Green  Procedure(s) Performed: Procedure(s) with comments: PRIMARY CESAREAN SECTION (N/A) - Classical incision  Patient Location: [facility] Unit  Anesthesia Type:General  Level of Consciousness: awake, alert  and oriented  Airway and Oxygen Therapy: Patient Spontanous Breathing and Patient connected to nasal cannula oxygen  Post-op Pain: none  Post-op Assessment: Post-op Vital signs reviewed, Patient's Cardiovascular Status Stable, No headache, No backache, No residual numbness and No residual motor weakness  Post-op Vital Signs: Reviewed and stable  Complications: No apparent anesthesia complications

## 2013-01-17 NOTE — Progress Notes (Signed)
I visited with pt while making rounds on the unit.  She was anxious about her babies' progress and about her ability to pump.  She is still somewhat processing all that has happened so quickly and she is also dealing with her own recovery.  We will continue to follow for support, but please also page as needs arise.  [employer]  Pager, [phone] 2:24 PM

## 2013-01-18 MED ORDER — INFLUENZA VAC SPLIT QUAD 0.5 ML IM SUSP
0.5000 mL | INTRAMUSCULAR | Status: AC
  Administered 2013-01-20: 0.5 mL via INTRAMUSCULAR
  Filled 2013-01-18: qty 0.5

## 2013-01-18 NOTE — Progress Notes (Signed)
  Patient is eating, ambulating, voiding.  Pain control is good.  Filed Vitals:   01/17/13 1804 01/17/13 2230 01/18/13 0024 01/18/13 0540  BP: 113/57 102/71  109/69  Pulse: 97 95  81  Temp: 97.6 F (36.4 C) 98.4 F (36.9 C)  98.1 F (36.7 C)  TempSrc: Oral Oral  Oral  Resp: 20 18  16   Height:   5\' 6"  (1.676 m)   Weight:   84.369 kg (186 lb)   SpO2: 100% 100%  98%    lungs:   clear to auscultation cor:    RRR Abdomen:  soft, appropriate tenderness, incisions intact and without erythema or exudate ex:    no cords   Lab Results  Component Value Date   WBC 13.3* 01/17/2013   HGB 9.8* 01/17/2013   HCT 29.0* 01/17/2013   MCV 98.0 01/17/2013   PLT 167 01/17/2013    --/--/A POS, A POS (10/26 1715)/RI  A/P    Post operative day 1.  Routine post op and postpartum care.  Expect d/c in two days.  Percocet for pain control.

## 2013-01-18 NOTE — Lactation Note (Signed)
This note was copied from the chart of [name] A Wynelle Cleveland. Lactation Consultation Note  Mom states she is pumping and hand expressing, is getting a little more volume ( drops at this time).  Mom and dad had numerous questions regarding lactation and breast feeding, as they had been planning to attend the breast feeding class on December 9, and the babies were born before they were able to attend class. Extensive teaching and discussion regarding lactation; all questions answered.  Inst mom to pump at least 8 times per day, 15 to 20 minutes, followed by hand expression for 3 to 4 minutes.  Pump rental packet given, will check on later. Mom states she has a friend with a 50 month old who is currently breast feeding, and questions using that mom's milk for her babies in case she does not have enough for them. Discussed human donor milk risk and benefits; signed consent in chart.  Enc mom to call if she has any concerns.   Patient Name: [name] Green Today's Date: 01/18/2013     Maternal Data    Feeding    LATCH Score/Interventions                      Lactation Tools Discussed/Used     Consult Status      Lenard Forth 01/18/2013, 10:35 AM

## 2013-01-19 ENCOUNTER — Ambulatory Visit (HOSPITAL_COMMUNITY): Payer: BC Managed Care – PPO

## 2013-01-19 ENCOUNTER — Encounter (HOSPITAL_COMMUNITY)
Admit: 2013-01-19 | Discharge: 2013-01-19 | Disposition: A | Discharge status: Home / Self Care | Payer: BC Managed Care – PPO

## 2013-01-19 NOTE — Progress Notes (Signed)
Pt currently down in NICU

## 2013-01-19 NOTE — Lactation Note (Signed)
This note was copied from the chart of [name] A Wynelle Cleveland. Lactation Consultation Note Follow up consultation. Reviewed pumping, hand expression, milk storage and transport, pump rental.  Mom states she is starting to get more volume with pumping.  Pump rental paper work received, rental pump provided with instructions for use. Questions answered. Reassured mom that lactation support is available to her; lactation office phone number provided; will follow up at NICU bedside. Enc mom to call the lactation office if she has any questions or concerns.   Patient Name: [name] Green Today's Date: 01/19/2013     Maternal Data    Feeding    LATCH Score/Interventions                      Lactation Tools Discussed/Used     Consult Status      Lenard Forth 01/19/2013, 11:44 AM

## 2013-01-20 MED ORDER — OXYCODONE-ACETAMINOPHEN 5-325 MG PO TABS: 1.0000 | ORAL_TABLET | ORAL | Status: DC | PRN

## 2013-01-20 NOTE — Progress Notes (Signed)
Pt. Is discharged in the care of [facility]. Downstairs per ambulatory. Spirits are good. Infants to remain in [facility]. Abdominal incisions clean and dry. Homeycomb dressing is clean and dry.

## 2013-01-20 NOTE — Progress Notes (Signed)
  Patient is eating, ambulating, voiding.  Pain control is fair.  Pt trying to pump for breast milk- babies in NICU.  Filed Vitals:   01/19/13 1200 01/19/13 1732 01/19/13 2158 01/20/13 0609  BP: 125/78 130/76 119/85 122/76  Pulse: 91 85 99 92  Temp:  97.5 F (36.4 C) 98.1 F (36.7 C) 98 F (36.7 C)  TempSrc:  Oral Oral Oral  Resp: 16 18 18 18   Height:      Weight:      SpO2: 98% 100% 100% 100%    lungs:   clear to auscultation cor:    RRR Abdomen:  soft, appropriate tenderness, incisions intact and without erythema or exudate ex:    no cords   Lab Results  Component Value Date   WBC 13.3* 01/17/2013   HGB 9.8* 01/17/2013   HCT 29.0* 01/17/2013   MCV 98.0 01/17/2013   PLT 167 01/17/2013    --/--/A POS, A POS (10/26 1715)/RI  A/P    Post operative day 3.  Routine post op and postpartum care.  Pt desires to stay one more day for pain control and lactation consult.  Percocet for pain control.

## 2013-01-20 NOTE — Discharge Summary (Signed)
Obstetric Discharge Summary Reason for Admission: onset of labor Prenatal Procedures: ultrasound Intrapartum Procedures: cesarean: low cervical, transverse Postpartum Procedures: none Complications-Operative and Postpartum: none Hemoglobin  Date Value Range Status  01/17/2013 9.8* 12.0 - 15.0 g/dL Final     REPEATED TO VERIFY     DELTA CHECK NOTED     HCT  Date Value Range Status  01/17/2013 29.0* 36.0 - 46.0 % Final   Hospital course: 37 y.o. G3P0020 Estimated Date of Delivery: 04/26/13 admitted at 25/[redacted] weeks gestation in advanced preterm labor. Patient went to MAU with contractions and found to be 7 cm. Orders written for Magnesium and Steroid protocol in the hope that labor could be arrested for 24 - 48 hours. She progressed rapidly, however, and because baby A was Breech she was brought to the OR by the in house Ob, Dr. Nicholaus Bloom, for a STAT cesarean delivery.  Pt's postop course was uncomplicated.  Twins are doing well in the NICU.   Discharge Diagnoses: Premature labor and delivery of mono/di twins  Discharge Information: Date: 01/20/2013 Activity: pelvic rest Diet: routine Medications: Iron and Percocet Condition: stable Instructions: refer to practice specific booklet Discharge to: home Follow-up Information   Follow up with Mickel Baas, MD In 4 weeks.   Specialty:  Obstetrics and Gynecology   Contact information:   [address] [place] [address] [phone]       Newborn Data:   Mirta, Mally [[id number]]  Live born female  Birth Weight: 1 lb 15.8 oz (900 g) APGAR: 5, 7   Brunetta, Newingham [[id number]]  Live born female  Birth Weight: 1 lb 14.7 oz (870 g) APGAR: 5, 7  Home with twins in NICU.  Terryann Verbeek A 01/20/2013, 8:06 AM

## 2013-01-21 NOTE — Progress Notes (Signed)
Clinical Social Work Department PSYCHOSOCIAL ASSESSMENT - MATERNAL/CHILD 01/20/2013  Patient:  [name],[name]  Account Number:  [id number]  Admit Date:  01/16/2013  Childs Name:   [name] [name]  B-[name] [name]    Clinical Social Worker:  Malajah Oceguera, LCSW   Date/Time:  01/20/2013 10:00 AM  Date Referred:  01/20/2013   Referral source  NICU     Referred reason  NICU   Other referral source:    I:  FAMILY / HOME ENVIRONMENT Child's legal guardian:  PARENT  Guardian - Name Guardian - Age Guardian - Address  [name] [name] 37 [address] [place], [place] [address]  [name] [name]  same   Other household support members/support persons Other support:   MOB reports having a good support system.  Her parents are currently here from [place], but will be traveling home today. Her brother is also visiting for a period of time.  She states they have a great support network of friends in the area.    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   Financial resources:  Private Insurance If Medicaid - County:    School / Grade:   Maternity Care Coordinator / Child Services Coordination / Early Interventions:  Cultural issues impacting care:   None stated    III  STRENGTHS Strengths  Adequate Resources  Compliance with medical plan  Understanding of illness   Strength comment:    IV  RISK FACTORS AND CURRENT PROBLEMS Current Problem:  None   Risk Factor & Current Problem Patient Issue Family Issue Risk Factor / Current Problem Comment   N N     V  SOCIAL WORK ASSESSMENT  CSW met with MOB and MGM in MOB's third floor room/305 to introduce myself and complete assessment due to premature birth of twins and admissions to NICU.  MOB and MGM were very pleasant, welcoming and receptive to CSW's visit.  She states she is taking  things one day at a time and has a great support system.  She seems be coping well and understanding of how critical the situation with her daughters (especially baby A) is at this time.  She and her mother report being very appreciative and impressed with the support they have been given by hospital staff so far.  CSW informed MOB of ongoing support by NICU CSW as well as babies' eligibility for SSI.  She is interested in applying so CSW will assist.  We talked at length about PPD signs and symptoms and how to monitor/process emotions given this difficult situation.  CSW informed MOB of babies' eligibility for SSI benefits and MOB was grateful for the information and assistance in completing the paperwork.  CSW will complete and obtain signatures at a later time.  CSW gave contact information and encouraged family to call for support/assistance at any time.  MOB and MGM were very appreciative and thanked CSW for the visit.   VI SOCIAL WORK PLAN Social Work Plan  Psychosocial Support/Ongoing Assessment of Needs   Type of pt/family education:   PPD signs and symptoms  SSI eligibility for both babies  ongoing support offered by NICU CSW   If child protective services report - county:   If child protective services report - date:   Information/referral to community resources comment:   SSI   Other social work plan:   

## 2013-02-19 ENCOUNTER — Encounter (HOSPITAL_COMMUNITY)
Admission: RE | Admit: 2013-02-19 | Discharge: 2013-02-19 | Disposition: A | Discharge status: Home / Self Care | Payer: BC Managed Care – PPO | Source: Ambulatory Visit | Attending: Obstetrics and Gynecology | Admitting: Obstetrics and Gynecology

## 2013-02-19 DIAGNOSIS — O923 Agalactia: Secondary | ICD-10-CM | POA: Insufficient documentation

## 2013-04-06 ENCOUNTER — Telehealth (HOSPITAL_COMMUNITY): Payer: Self-pay | Admitting: Family Medicine

## 2013-04-06 ENCOUNTER — Emergency Department (HOSPITAL_COMMUNITY)
Admission: EM | Admit: 2013-04-06 | Discharge: 2013-04-06 | Disposition: A | Discharge status: Home / Self Care | Payer: BC Managed Care – PPO | Source: Home / Self Care | Attending: Family Medicine | Admitting: Family Medicine

## 2013-04-06 ENCOUNTER — Encounter (HOSPITAL_COMMUNITY): Payer: Self-pay | Admitting: Emergency Medicine

## 2013-04-06 DIAGNOSIS — N61 Mastitis without abscess: Secondary | ICD-10-CM

## 2013-04-06 DIAGNOSIS — R509 Fever, unspecified: Secondary | ICD-10-CM

## 2013-04-06 LAB — INFLUENZA PANEL BY PCR (TYPE A & B)
H1N1 flu by pcr: NOT DETECTED
INFLBPCR: NEGATIVE
Influenza A By PCR: NEGATIVE

## 2013-04-06 MED ORDER — CEPHALEXIN 500 MG PO CAPS: 500.0000 mg | ORAL_CAPSULE | Freq: Four times a day (QID) | ORAL | Status: DC

## 2013-04-06 NOTE — ED Notes (Signed)
Patient's influenza test were negative. Call patient with results plan to continue treatment for mastitis  [name] BongEvan S Timothea Bodenheimer, MD 04/06/13 [phone]

## 2013-04-06 NOTE — ED Notes (Signed)
Reports fever, chills. Concern for mastitis vs flu. [facility] ,and has been advised to be checked

## 2013-04-06 NOTE — ED Provider Notes (Signed)
[name] Green is a 38 y.o. female who presents to Urgent Care today for fevers chills body aches and right breast pain. Patient is a mother of 2 very premature infants [facility]. They were born at 3225 weeks and are currently 3180 days old. Neither are currently intubated. However over the last day she has developed the above symptoms in the NICU staff is concerned about the possibility of influenza. She is here today for flu testing.  Additionally she notes right breast tenderness firmness and pain. She is concerned that she may have mastitis.   History reviewed. No pertinent past medical history. History  Substance Use Topics  . Smoking status: Never Smoker   . Smokeless tobacco: Not on file  . Alcohol Use: Not on file   ROS as above Medications: No current facility-administered medications for this encounter.   Current Outpatient Prescriptions  Medication Sig Dispense Refill  . cephALEXin (KEFLEX) 500 MG capsule Take 1 capsule (500 mg total) by mouth 4 (four) times daily.  40 capsule  0  . oxyCODONE-acetaminophen (ROXICET) 5-325 MG per tablet Take 1 tablet by mouth every 4 (four) hours as needed for pain.  30 tablet  0  . Prenatal Vit-Fe Fumarate-FA (PRENATAL MULTIVITAMIN) TABS tablet Take 1 tablet by mouth daily at 12 noon.        Exam:  BP 108/65  Pulse 98  Temp(Src) 98.6 F (37 C) (Oral)  Resp 18  SpO2 96% Gen: Well NAD HEENT: EOMI,  MMM no nasal inflammation.  Lungs: Normal work of breathing. CTABL Heart: RRR no MRG Breast: Right breast erythematous indurated and tender on the inferior surface. Left is normal Abd: NABS, Soft. NT, ND Exts: Brisk capillary refill, warm and well perfused.    Assessment and Plan: 38 y.o. female with mastitis is the most likely explanation of both her systemic symptoms and her breast pain. Plan to treat with Keflex. Additionally I am also concerned about the possibility of influenza as this will  significantly impact her children safety. We'll perform a nasopharyngeal swab for influenza PCR testing. I discussed with the lab and they will call me with test results in approximately 3 hours. I have called infection control and will call the patient and [facility] if the influenza test is positive.  Discussed warning signs or symptoms. Please see discharge instructions. Patient expresses understanding.    Rodolph BongEvan S Corey, MD 04/06/13 [phone]

## 2013-04-06 NOTE — Discharge Instructions (Signed)
Thank you for coming in today. Start taking Keflex 4 times daily for 10 days. I will call you with your flu results this evening. If you do not hear from me by 8:00 please call [phone] and ask to speak with Dr. Denyse Amass.  Breastfeeding and Mastitis Mastitis is inflammation of the breast tissue. It can occur in women who are breastfeeding. This can make breastfeeding painful. Mastitis will sometimes go away on its own. Your health care provider will help determine if treatment is needed. CAUSES Mastitis is often associated with a blocked milk (lactiferous) duct. This can happen when too much milk builds up in the breast. Causes of excess milk in the breast can include:  Poor latch-on. If your baby is not latched onto the breast properly, she or he may not empty your breast completely while breastfeeding.  Allowing too much time to pass between feedings.  Wearing a bra or other clothing that is too tight. This puts extra pressure on the lactiferous ducts so milk does not flow through them as it should. Mastitis can also be caused by a bacterial infection. Bacteria may enter the breast tissue through cuts or openings in the skin. In women who are breastfeeding, this may occur because of cracked or irritated skin. Cracks in the skin are often caused when your baby does not latch on properly to the breast. SIGNS AND SYMPTOMS  Swelling, redness, tenderness, and pain in an area of the breast.  Swelling of the glands under the arm on the same side.  Fever may or may not accompany mastitis. If an infection is allowed to progress, a collection of pus (abscess) may develop. DIAGNOSIS  Your health care provider can usually diagnose mastitis based on your symptoms and a physical exam. Tests may be done to help confirm the diagnosis. These may include:  Removal of pus from the breast by applying pressure to the area. This pus can be examined in the lab to determine which bacteria are present. If an  abscess has developed, the fluid in the abscess can be removed with a needle. This can also be used to confirm the diagnosis and determine the bacteria present. In most cases, pus will not be present.  Blood tests to determine if your body is fighting a bacterial infection.  Mammogram or ultrasound tests to rule out other problems or diseases. TREATMENT  Mastitis that occurs with breastfeeding will sometimes go away on its own. Your health care provider may choose to wait 24 hours after first seeing you to decide whether a prescription medicine is needed. If your symptoms are worse after 24 hours, your health care provider will likely prescribe an antibiotic to treat the mastitis. He or she will determine which bacteria are most likely causing the infection and will then select an appropriate antibiotic. This is sometimes changed based on the results of tests performed to identify the bacteria, or if there is no response to the antibiotic selected. Antibiotics are usually given by mouth. You may also be given medicine for pain. HOME CARE INSTRUCTIONS  Only take over-the-counter or prescription medicines for pain, fever, or discomfort as directed by your health care provider.  If your health care provider prescribed an antibiotic, take the medicine as directed. Make sure you finish it even if you start to feel better.  Do not wear a tight or underwire bra. Wear a soft, supportive bra.  Increase your fluid intake, especially if you have a fever.  Continue to empty the breast.  Your health care provider can tell you whether this milk is safe for your infant or needs to be thrown out. You may be told to stop nursing until your health care provider thinks it is safe for your baby. Use a breast pump if you are advised to stop nursing.  Keep your nipples clean and dry.  Empty the first breast completely before going to the other breast. If your baby is not emptying your breasts completely for some  reason, use a breast pump to empty your breasts.  If you go back to work, pump your breasts while at work to stay in time with your nursing schedule.  Avoid allowing your breasts to become overly filled with milk (engorged). SEEK MEDICAL CARE IF:  You have pus-like discharge from the breast.  Your symptoms do not improve with the treatment prescribed by your health care provider within 2 days. SEEK IMMEDIATE MEDICAL CARE IF:  Your pain and swelling are getting worse.  You have pain that is not controlled with medicine.  You have a red line extending from the breast toward your armpit.  You have a fever or persistent symptoms for more than 2 3 days.  You have a fever and your symptoms suddenly get worse. MAKE SURE YOU:   Understand these instructions.  Will watch your condition.  Will get help right away if you are not doing well or get worse. Document Released: 07/05/2004 Document Revised: 11/10/2012 Document Reviewed: 10/14/2012 [facility] Patient Information 2014 [place].

## 2013-04-06 NOTE — ED Notes (Signed)
Sur name of daughters : eva & mila hoyne

## 2013-07-11 ENCOUNTER — Other Ambulatory Visit: Payer: Self-pay | Admitting: Obstetrics and Gynecology

## 2013-08-27 ENCOUNTER — Encounter (HOSPITAL_COMMUNITY): Payer: Self-pay | Admitting: *Deleted

## 2013-08-27 ENCOUNTER — Inpatient Hospital Stay (HOSPITAL_COMMUNITY)
Admission: AD | Admit: 2013-08-27 | Discharge: 2013-08-27 | Disposition: A | Discharge status: Home / Self Care | Payer: BC Managed Care – PPO | Source: Ambulatory Visit | Attending: Obstetrics & Gynecology | Admitting: Obstetrics & Gynecology

## 2013-08-27 ENCOUNTER — Inpatient Hospital Stay (HOSPITAL_COMMUNITY): Payer: BC Managed Care – PPO

## 2013-08-27 DIAGNOSIS — O2390 Unspecified genitourinary tract infection in pregnancy, unspecified trimester: Secondary | ICD-10-CM | POA: Insufficient documentation

## 2013-08-27 DIAGNOSIS — O26859 Spotting complicating pregnancy, unspecified trimester: Secondary | ICD-10-CM | POA: Insufficient documentation

## 2013-08-27 DIAGNOSIS — N760 Acute vaginitis: Secondary | ICD-10-CM | POA: Insufficient documentation

## 2013-08-27 DIAGNOSIS — A499 Bacterial infection, unspecified: Secondary | ICD-10-CM | POA: Insufficient documentation

## 2013-08-27 DIAGNOSIS — O209 Hemorrhage in early pregnancy, unspecified: Secondary | ICD-10-CM

## 2013-08-27 DIAGNOSIS — B9689 Other specified bacterial agents as the cause of diseases classified elsewhere: Secondary | ICD-10-CM | POA: Insufficient documentation

## 2013-08-27 LAB — URINE MICROSCOPIC-ADD ON

## 2013-08-27 LAB — WET PREP, GENITAL: Trich, Wet Prep: NONE SEEN

## 2013-08-27 LAB — URINALYSIS, ROUTINE W REFLEX MICROSCOPIC
Bilirubin Urine: NEGATIVE
Glucose, UA: NEGATIVE mg/dL
Ketones, ur: 15 mg/dL — AB
LEUKOCYTES UA: NEGATIVE
Nitrite: NEGATIVE
PROTEIN: NEGATIVE mg/dL
Specific Gravity, Urine: >1.03 — ABNORMAL HIGH (ref 1.005–1.030)
UROBILINOGEN UA: 0.2 mg/dL (ref 0.0–1.0)
pH: 6 (ref 5.0–8.0)

## 2013-08-27 MED ORDER — METRONIDAZOLE 500 MG PO TABS: 500.0000 mg | ORAL_TABLET | Freq: Two times a day (BID) | ORAL | Status: AC

## 2013-08-27 NOTE — MAU Provider Note (Signed)
Chief Complaint: Vaginal Bleeding   First Provider Initiated Contact with Patient 08/27/13 1722     SUBJECTIVE HPI: [name] Green is a 38 y.o. [id number] at [redacted]w[redacted]d by LMP who had streak of pink spotting noted on toilet paper shortly after having sexual intercourse today. No other bleeding episodes this pregnancy. Denies cramping or pain. Denies dysuria, hematuria, urinary urgency or frequency. No irritative vaginal discharge.  Pregnancy course: Has had OB visits and [place]. Plans for 17-P.    History reviewed. No pertinent past medical history. OB History  Gravida Para Term Preterm AB SAB TAB Ectopic Multiple Living  4 1 0 1 2 0 2 0 1 2     # Outcome Date GA Lbr Len/2nd Weight Sex Delivery Anes PTL Lv  4 CUR           3A PRE 01/16/13 [redacted]w[redacted]d  0.9 kg (1 lb 15.8 oz) F LTCS Gen  Y  3B  01/16/13 [redacted]w[redacted]d   F LTCS Gen  Y  2 TAB           1 TAB              Past Surgical History  Procedure Laterality Date  . Cesarean section N/A 01/16/2013    Procedure: PRIMARY CESAREAN SECTION;  Surgeon: Allie Bossier, MD;  Location: [facility];  Service: Obstetrics;  Laterality: N/A;  Classical incision   History   Social History  . Marital Status: Married    Spouse Name: N/A    Number of Children: N/A  . Years of Education: N/A   Occupational History  . Not on file.   Social History Main Topics  . Smoking status: Never Smoker   . Smokeless tobacco: Not on file  . Alcohol Use: Not on file  . Drug Use: Not on file  . Sexual Activity: Not on file   Other Topics Concern  . Not on file   Social History Narrative  . No narrative on file   No current facility-administered medications on file prior to encounter.   No current outpatient prescriptions on file prior to encounter.   No Known Allergies  ROS: Pertinent items in HPI  OBJECTIVE Blood pressure 120/69, pulse 92, temperature 98.6 F (37 C), temperature source Oral, resp. rate 18, height 5\' 4"  (1.626 m), weight 83.462 kg (184 lb), last menstrual  period 07/20/2012, unknown if currently breastfeeding. GENERAL: Well-developed, well-nourished female in no acute distress.  HEENT: Normocephalic HEART: normal rate RESP: normal effort ABDOMEN: Soft, non-tender, DT FHR 150 EXTREMITIES: Nontender, no edema NEURO: Alert and oriented SPECULUM EXAM: NEFG, scant brown blood on q-tip, cervix clean BIMANUAL: cervix thick/closed; uterus 14 wks size, no adnexal tenderness or masses  LAB RESULTS Results for orders placed during the hospital encounter of 08/27/13 (from the past 24 hour(s))  URINALYSIS, ROUTINE W REFLEX MICROSCOPIC     Status: Abnormal   Collection Time    08/27/13  4:30 PM      Result Value Ref Range   Color, Urine YELLOW  YELLOW   APPearance CLEAR  CLEAR   Specific Gravity, Urine >1.030 (*) 1.005 - 1.030   pH 6.0  5.0 - 8.0   Glucose, UA NEGATIVE  NEGATIVE mg/dL   Hgb urine dipstick MODERATE (*) NEGATIVE   Bilirubin Urine NEGATIVE  NEGATIVE   Ketones, ur 15 (*) NEGATIVE mg/dL   Protein, ur NEGATIVE  NEGATIVE mg/dL   Urobilinogen, UA 0.2  0.0 - 1.0 mg/dL   Nitrite NEGATIVE  NEGATIVE  Leukocytes, UA NEGATIVE  NEGATIVE  URINE MICROSCOPIC-ADD ON     Status: None   Collection Time    08/27/13  4:30 PM      Result Value Ref Range   Squamous Epithelial / LPF RARE  RARE   RBC / HPF 3-6  <3 RBC/hpf   Bacteria, UA RARE  RARE  WET PREP, GENITAL     Status: Abnormal   Collection Time    08/27/13  5:30 PM      Result Value Ref Range   Yeast Wet Prep HPF POC FEW (*) NONE SEEN   Trich, Wet Prep NONE SEEN  NONE SEEN   Clue Cells Wet Prep HPF POC FEW (*) NONE SEEN   WBC, Wet Prep HPF POC FEW (*) NONE SEEN    IMAGING Prelim report: cx appears long, closed    MAU COURSE Consult Dr. Mora ApplPinn  ASSESSMENT 1. Bleeding in early pregnancy   2. BV (bacterial vaginosis)   postcoital spotting   PLAN Discharge home    Medication List         acetaminophen 500 MG tablet  Commonly known as:  TYLENOL  Take 1,000 mg by mouth  every 6 (six) hours as needed for headache.     metroNIDAZOLE 500 MG tablet  Commonly known as:  FLAGYL  Take 1 tablet (500 mg total) by mouth 2 (two) times daily.     prenatal multivitamin Tabs tablet  Take 1 tablet by mouth daily at 12 noon.         Follow-up Information   [facility], Sanjuana MaeWALDA STACIA, MD. (Keep your scheduled prenatal appointment)    Specialty:  Obstetrics and Gynecology   Contact information:   [address] [place] [address] [phone]       Danae OrleansDeirdre C Tyquez Hollibaugh, CNM 08/27/2013  5:23 PM   pi

## 2013-08-27 NOTE — MAU Note (Signed)
Light pink spotting, first noted at 0300.  Hasn't not gotten any heavier, possibly lighter. Maybe a little cramping.

## 2013-08-27 NOTE — Discharge Instructions (Signed)
Vaginal Bleeding During Pregnancy, First Trimester °A small amount of bleeding (spotting) from the vagina is relatively common in early pregnancy. It usually stops on its own. Various things may cause bleeding or spotting in early pregnancy. Some bleeding may be related to the pregnancy, and some may not. In most cases, the bleeding is normal and is not a problem. However, bleeding can also be a sign of something serious. Be sure to tell your health care provider about any vaginal bleeding right away. °Some possible causes of vaginal bleeding during the first trimester include: °· Infection or inflammation of the cervix. °· Growths (polyps) on the cervix. °· Miscarriage or threatened miscarriage. °· Pregnancy tissue has developed outside of the uterus and in a fallopian tube (tubal pregnancy). °· Tiny cysts have developed in the uterus instead of pregnancy tissue (molar pregnancy). °HOME CARE INSTRUCTIONS  °Watch your condition for any changes. The following actions may help to lessen any discomfort you are feeling: °· Follow your health care provider's instructions for limiting your activity. If your health care provider orders bed rest, you may need to stay in bed and only get up to use the bathroom. However, your health care provider may allow you to continue light activity. °· If needed, make plans for someone to help with your regular activities and responsibilities while you are on bed rest. °· Keep track of the number of pads you use each day, how often you change pads, and how soaked (saturated) they are. Write this down. °· Do not use tampons. Do not douche. °· Do not have sexual intercourse or orgasms until approved by your health care provider. °· If you pass any tissue from your vagina, save the tissue so you can show it to your health care provider. °· Only take over-the-counter or prescription medicines as directed by your health care provider. °· Do not take aspirin because it can make you  bleed. °· Keep all follow-up appointments as directed by your health care provider. °SEEK MEDICAL CARE IF: °· You have any vaginal bleeding during any part of your pregnancy. °· You have cramps or labor pains. °SEEK IMMEDIATE MEDICAL CARE IF:  °· You have severe cramps in your back or belly (abdomen). °· You have a fever, not controlled by medicine. °· You pass large clots or tissue from your vagina. °· Your bleeding increases. °· You feel lightheaded or weak, or you have fainting episodes. °· You have chills. °· You are leaking fluid or have a gush of fluid from your vagina. °· You pass out while having a bowel movement. °MAKE SURE YOU: °· Understand these instructions. °· Will watch your condition. °· Will get help right away if you are not doing well or get worse. °Document Released: 12/18/2004 Document Revised: 12/29/2012 Document Reviewed: 11/15/2012 °ExitCare® Patient Information ©2014 [employer]. ° °

## 2013-08-27 NOTE — MAU Note (Signed)
38 yo, G4P2 at [redacted]w[redacted]d, presents to MAU with c/o noting pink-tinged bleeding when wiping after intercourse today. Reports mild cramping.

## 2013-08-29 NOTE — MAU Provider Note (Signed)
Reviewed case with CNM and I agree with above note.   Carolanne Mercier  

## 2013-10-22 ENCOUNTER — Inpatient Hospital Stay (HOSPITAL_COMMUNITY): Payer: BC Managed Care – PPO

## 2013-10-22 ENCOUNTER — Inpatient Hospital Stay (HOSPITAL_COMMUNITY)
Admission: AD | Admit: 2013-10-22 | Discharge: 2013-10-23 | Disposition: A | Discharge status: Home / Self Care | Payer: BC Managed Care – PPO | Source: Ambulatory Visit | Attending: Obstetrics and Gynecology | Admitting: Obstetrics and Gynecology

## 2013-10-22 ENCOUNTER — Encounter (HOSPITAL_COMMUNITY): Payer: Self-pay

## 2013-10-22 DIAGNOSIS — O4692 Antepartum hemorrhage, unspecified, second trimester: Secondary | ICD-10-CM

## 2013-10-22 DIAGNOSIS — O209 Hemorrhage in early pregnancy, unspecified: Secondary | ICD-10-CM | POA: Insufficient documentation

## 2013-10-22 LAB — URINALYSIS, ROUTINE W REFLEX MICROSCOPIC
Bilirubin Urine: NEGATIVE
GLUCOSE, UA: NEGATIVE mg/dL
Ketones, ur: NEGATIVE mg/dL
LEUKOCYTES UA: NEGATIVE
Nitrite: NEGATIVE
PH: 6.5 (ref 5.0–8.0)
Protein, ur: NEGATIVE mg/dL
SPECIFIC GRAVITY, URINE: 1.01 (ref 1.005–1.030)
Urobilinogen, UA: 0.2 mg/dL (ref 0.0–1.0)

## 2013-10-22 LAB — WET PREP, GENITAL
Clue Cells Wet Prep HPF POC: NONE SEEN
Trich, Wet Prep: NONE SEEN
YEAST WET PREP: NONE SEEN

## 2013-10-22 LAB — URINE MICROSCOPIC-ADD ON

## 2013-10-22 NOTE — MAU Note (Signed)
Pt presents complaining of vaginal bleeding that started yesterday and was brown but became bright red this evening. History of preterm delivery at 25 weeks. States she was seen for bleeding earlier in the pregnancy and had BV.

## 2013-10-22 NOTE — MAU Provider Note (Signed)
History     CSN: [id number]  Arrival date and time: 10/22/13 2033   First Provider Initiated Contact with Patient 10/22/13 2056      Chief Complaint  Patient presents with  . Vaginal Bleeding   HPI Ms. [name] Green is a 38 y.o. [id number] at 93107w1d who presents to MAU today with complaint of vaginal bleeding. She states that she noted brown spotting last night and today it turned to red. She noted bleeding only with wiping and has not needed to wear a pad. She denies vaginal discharge, fever, contractions or LOF. She states h/o PTD with twins at 25 weeks with her last pregnancy. She states occasional mild cramping. Last intercourse was 2-3 days ago. She states +FM.   OB History   Grav Para Term Preterm Abortions TAB SAB Ect Mult Living   4 1 0 1 2 2 0 0 1 2       History reviewed. No pertinent past medical history.  Past Surgical History  Procedure Laterality Date  . Cesarean section N/A 01/16/2013    Procedure: PRIMARY CESAREAN SECTION;  Surgeon: Allie BossierMyra C Dove, MD;  Location: [facility];  Service: Obstetrics;  Laterality: N/A;  Classical incision    History reviewed. No pertinent family history.  History  Substance Use Topics  . Smoking status: Never Smoker   . Smokeless tobacco: Not on file  . Alcohol Use: Not on file    Allergies: No Known Allergies  Prescriptions prior to admission  Medication Sig Dispense Refill  . acetaminophen (TYLENOL) 500 MG tablet Take 1,000 mg by mouth every 6 (six) hours as needed for headache.      . Prenatal Vit-Fe Fumarate-FA (PRENATAL MULTIVITAMIN) TABS tablet Take 1 tablet by mouth daily.       . sertraline (ZOLOFT) 50 MG tablet Take 50 mg by mouth daily.        Review of Systems  Constitutional: Negative for fever and malaise/fatigue.  Gastrointestinal: Positive for abdominal pain.  Genitourinary: Negative for dysuria, urgency and frequency.       Neg - vaginal discharge + vaginal bleeding   Physical Exam   Blood pressure 121/75, pulse  103, temperature 98 F (36.7 C), temperature source Oral, resp. rate 18, last menstrual period 07/20/2012, unknown if currently breastfeeding.  Physical Exam  Constitutional: She is oriented to person, place, and time. She appears well-developed and well-nourished. No distress.  HENT:  Head: Normocephalic.  Cardiovascular: Normal rate.   Respiratory: Effort normal.  GI: Soft. She exhibits no distension and no mass. There is no tenderness. There is no rebound and no guarding.  Genitourinary: Uterus is enlarged (approrpiate for GA). Cervix exhibits no motion tenderness, no discharge and no friability. There is bleeding (small amount of brown blood in the vaginal vault) around the vagina. No vaginal discharge found.  Neurological: She is alert and oriented to person, place, and time.  Skin: Skin is warm and dry. No erythema.  Psychiatric: She has a normal mood and affect.  Dilation: Closed  Results for orders placed during the hospital encounter of 10/22/13 (from the past 24 hour(s))  URINALYSIS, ROUTINE W REFLEX MICROSCOPIC     Status: Abnormal   Collection Time    10/22/13  8:40 PM      Result Value Ref Range   Color, Urine YELLOW  YELLOW   APPearance CLEAR  CLEAR   Specific Gravity, Urine 1.010  1.005 - 1.030   pH 6.5  5.0 - 8.0   Glucose, UA NEGATIVE  NEGATIVE mg/dL   Hgb urine dipstick LARGE (*) NEGATIVE   Bilirubin Urine NEGATIVE  NEGATIVE   Ketones, ur NEGATIVE  NEGATIVE mg/dL   Protein, ur NEGATIVE  NEGATIVE mg/dL   Urobilinogen, UA 0.2  0.0 - 1.0 mg/dL   Nitrite NEGATIVE  NEGATIVE   Leukocytes, UA NEGATIVE  NEGATIVE  URINE MICROSCOPIC-ADD ON     Status: Abnormal   Collection Time    10/22/13  8:40 PM      Result Value Ref Range   Squamous Epithelial / LPF FEW (*) RARE   WBC, UA 0-2  <3 WBC/hpf   RBC / HPF 0-2  <3 RBC/hpf   Bacteria, UA FEW (*) RARE  WET PREP, GENITAL     Status: Abnormal   Collection Time    10/22/13  9:30 PM      Result Value Ref Range   Yeast Wet  Prep HPF POC NONE SEEN  NONE SEEN   Trich, Wet Prep NONE SEEN  NONE SEEN   Clue Cells Wet Prep HPF POC NONE SEEN  NONE SEEN   WBC, Wet Prep HPF POC FEW (*) NONE SEEN    MAU Course  Procedures None  MDM FHR - 140 bpm with doppler UA, wet prep today Discussed with Dr. Henderson Cloud. Recommends [place] today [place] preliminary shows normal placenta above the os with no evidence of abruption. Cervical length is 3.5 cm and appears normal.   Assessment and Plan  A: SIUP at [redacted]w[redacted]d Vaginal bleeding in pregnancy  P: Discharge home Bleeding precautions discussed Patient advised of pelvic rest until next OB follow-up Patient advised to follow-up with [facility] as scheduled for routine prenatal care Patient may return to MAU as needed or if her condition were to change or worsen   [name] Starr, PA-C  10/22/2013, 9:55 PM

## 2013-10-23 DIAGNOSIS — O4690 Antepartum hemorrhage, unspecified, unspecified trimester: Secondary | ICD-10-CM

## 2013-10-23 NOTE — Discharge Instructions (Signed)
Pelvic Rest Pelvic rest is sometimes recommended for women when:   The placenta is partially or completely covering the opening of the cervix (placenta previa).  There is bleeding between the uterine wall and the amniotic sac in the first trimester (subchorionic hemorrhage).  The cervix begins to open without labor starting (incompetent cervix, cervical insufficiency).  The labor is too early (preterm labor). HOME CARE INSTRUCTIONS  Do not have sexual intercourse, stimulation, or an orgasm.  Do not use tampons, douche, or put anything in the vagina.  Do not lift anything over 10 pounds (4.5 kg).  Avoid strenuous activity or straining your pelvic muscles. SEEK MEDICAL CARE IF:  You have any vaginal bleeding during pregnancy. Treat this as a potential emergency.  You have cramping pain felt low in the stomach (stronger than menstrual cramps).  You notice vaginal discharge (watery, mucus, or bloody).  You have a low, dull backache.  There are regular contractions or uterine tightening. SEEK IMMEDIATE MEDICAL CARE IF: You have vaginal bleeding and have placenta previa.  Document Released: 07/05/2010 Document Revised: 06/02/2011 Document Reviewed: 07/05/2010 [facility]ExitCare Patient Information 2015 [place], [place]. This information is not intended to replace advice given to you by your health care provider. Make sure you discuss any questions you have with your health care provider.  Vaginal Bleeding During Pregnancy, Second Trimester A small amount of bleeding (spotting) from the vagina is relatively common in pregnancy. It usually stops on its own. Various things can cause bleeding or spotting in pregnancy. Some bleeding may be related to the pregnancy, and some may not. Sometimes the bleeding is normal and is not a problem. However, bleeding can also be a sign of something serious. Be sure to tell your health care provider about any vaginal bleeding right away. Some possible causes of  vaginal bleeding during the second trimester include:  Infection, inflammation, or growths on the cervix.   The placenta may be partially or completely covering the opening of the cervix inside the uterus (placenta previa).  The placenta may have separated from the uterus (abruption of the placenta).   You may be having early (preterm) labor.   The cervix may not be strong enough to keep a baby inside the uterus (cervical insufficiency).   Tiny cysts may have developed in the uterus instead of pregnancy tissue (molar pregnancy). HOME CARE INSTRUCTIONS  Watch your condition for any changes. The following actions may help to lessen any discomfort you are feeling:  Follow your health care provider's instructions for limiting your activity. If your health care provider orders bed rest, you may need to stay in bed and only get up to use the bathroom. However, your health care provider may allow you to continue light activity.  If needed, make plans for someone to help with your regular activities and responsibilities while you are on bed rest.  Keep track of the number of pads you use each day, how often you change pads, and how soaked (saturated) they are. Write this down.  Do not use tampons. Do not douche.  Do not have sexual intercourse or orgasms until approved by your health care provider.  If you pass any tissue from your vagina, save the tissue so you can show it to your health care provider.  Only take over-the-counter or prescription medicines as directed by your health care provider.  Do not take aspirin because it can make you bleed.  Do not exercise or perform any strenuous activities or heavy lifting without your  health care provider's permission.  Keep all follow-up appointments as directed by your health care provider. SEEK MEDICAL CARE IF:  You have any vaginal bleeding during any part of your pregnancy.  You have cramps or labor pains.  You have a fever, not  controlled by medicine. SEEK IMMEDIATE MEDICAL CARE IF:   You have severe cramps in your back or belly (abdomen).  You have contractions.  You have chills.  You pass large clots or tissue from your vagina.  Your bleeding increases.  You feel light-headed or weak, or you have fainting episodes.  You are leaking fluid or have a gush of fluid from your vagina. MAKE SURE YOU:  Understand these instructions.  Will watch your condition.  Will get help right away if you are not doing well or get worse. Document Released: 12/18/2004 Document Revised: 03/15/2013 Document Reviewed: 11/15/2012 [facility] Patient Information 2015 [place], [place]. This information is not intended to replace advice given to you by your health care provider. Make sure you discuss any questions you have with your health care provider.

## 2013-10-23 NOTE — Progress Notes (Signed)
[name] BerkshireJulie Ethier PA in earlier to discuss u/s results and d/c plan. Written and verbal d/c instructions given and understanding voiced.

## 2013-11-24 IMAGING — US US OB LIMITED
1 series · 13 of 28 positions shown · non-contrast
Comparison: none

[Series 1: us ob limited · 0.20mm/px · 13 of 33 slices shown]
[im 2/33]
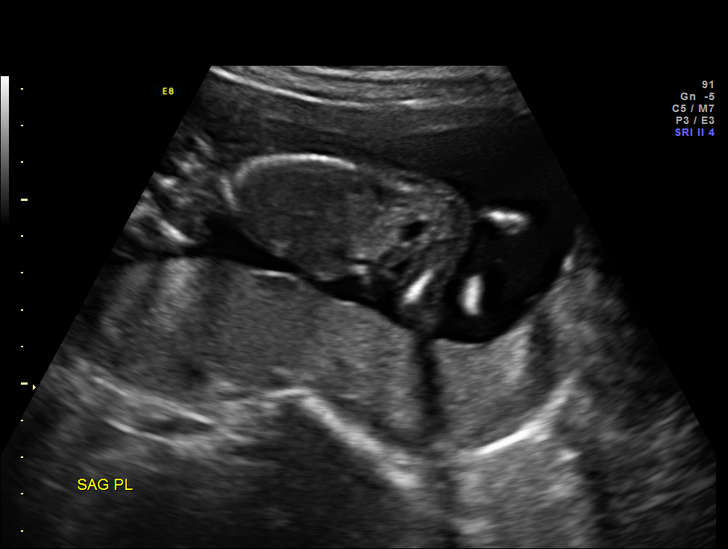
[im 4/33]
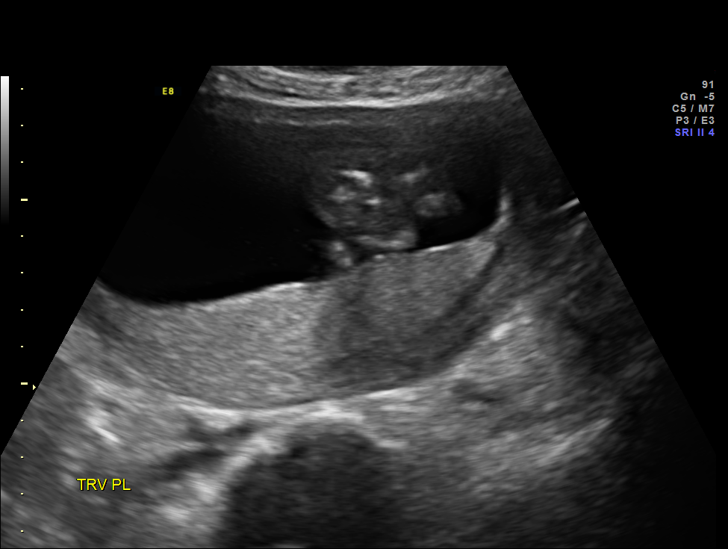
[im 6/33]
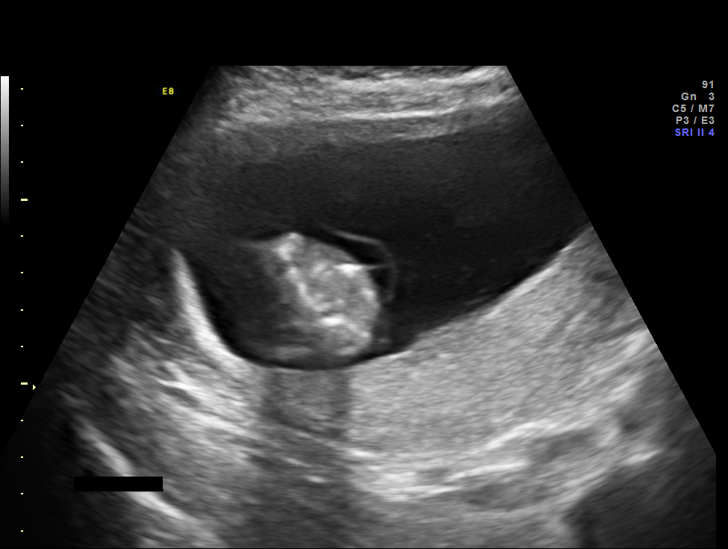
[im 9/33]
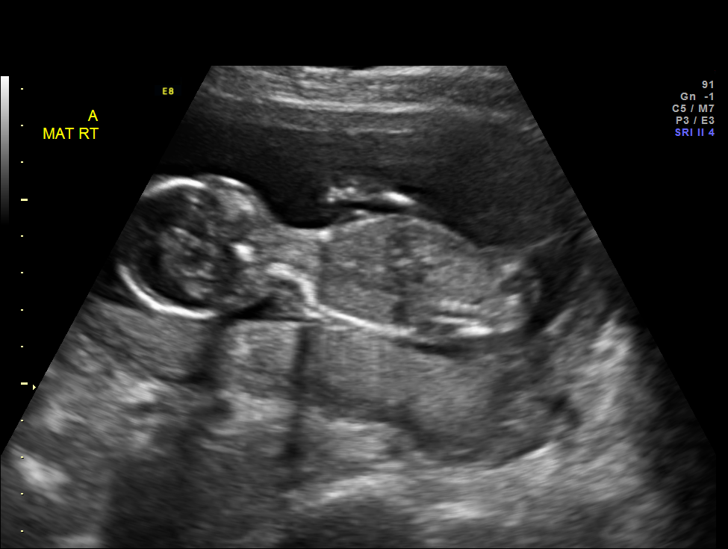
[im 11/33]
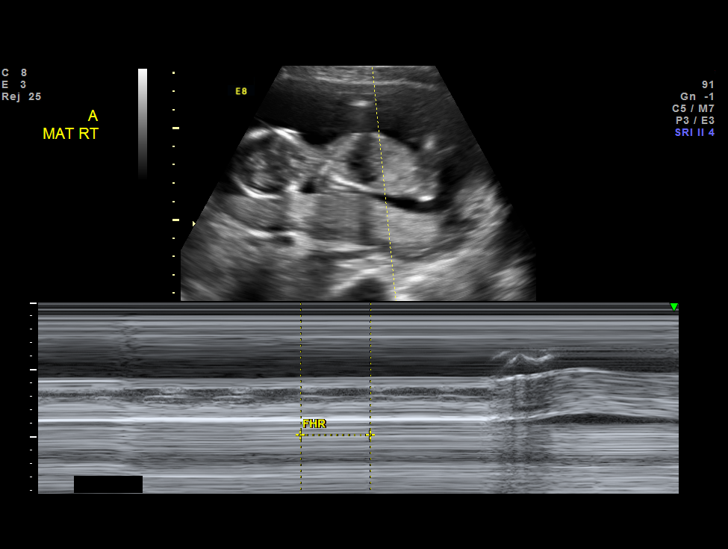
[im 14/33]
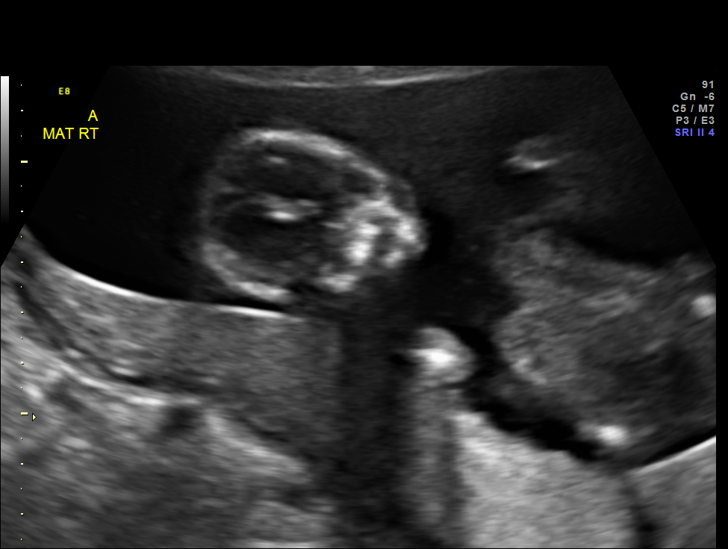
[im 17/33]
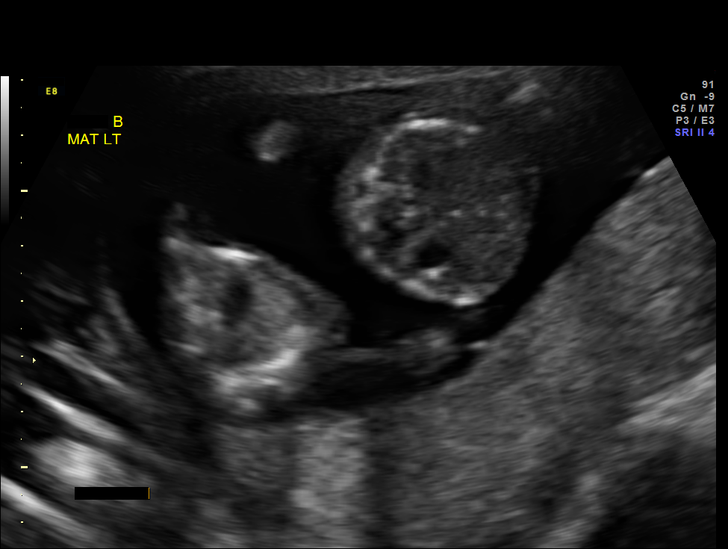
[im 19/33]
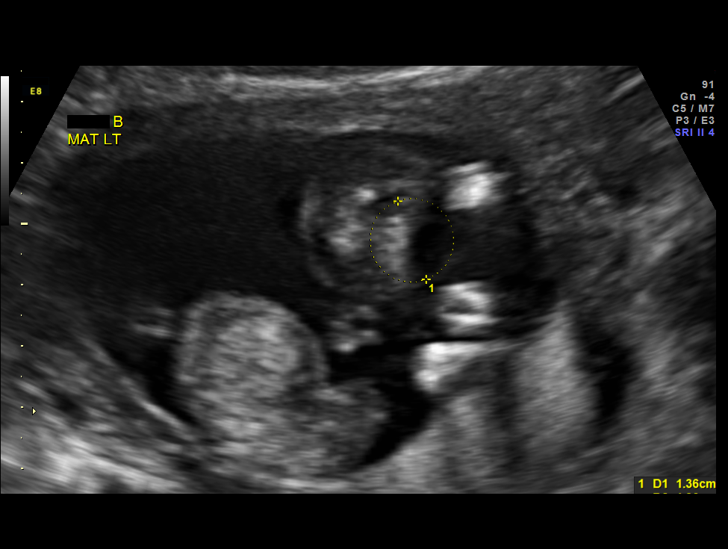
[im 22/33]
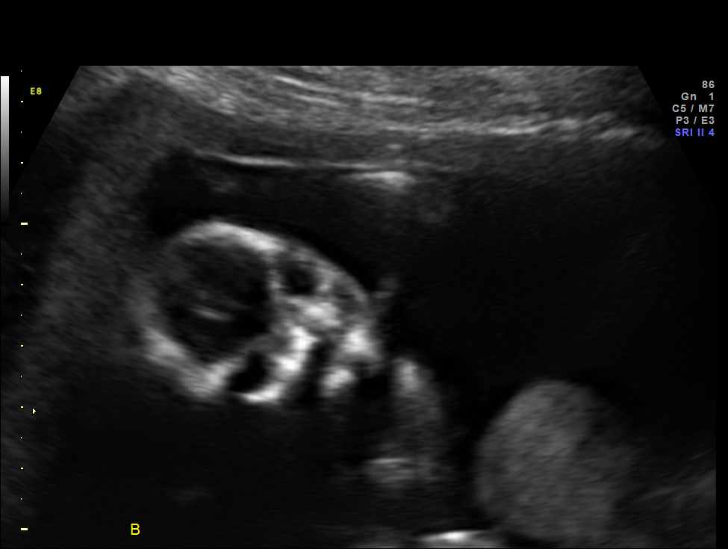
[im 24/33]
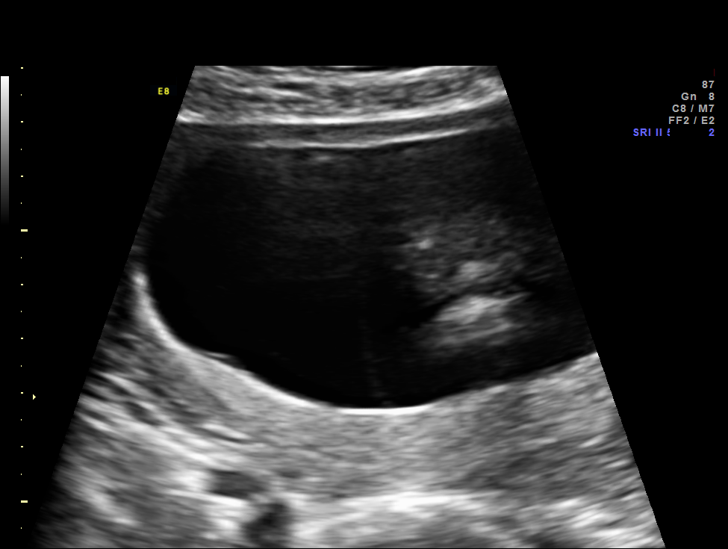
[im 27/33]
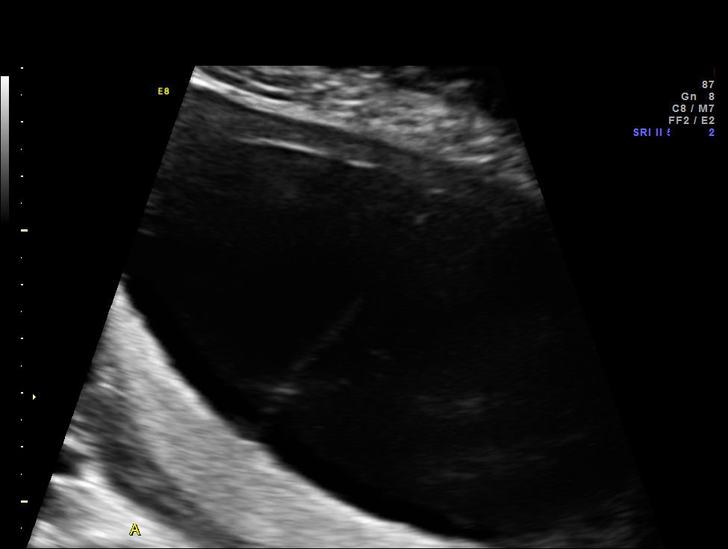
[im 29/33]
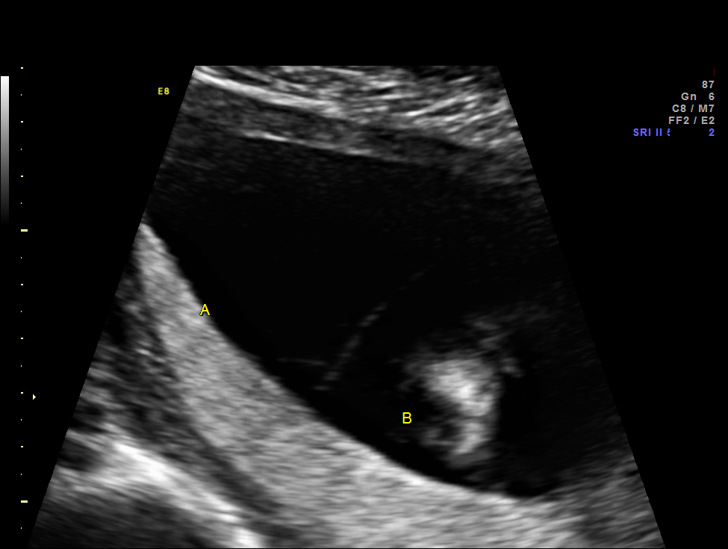
[im 31/33]
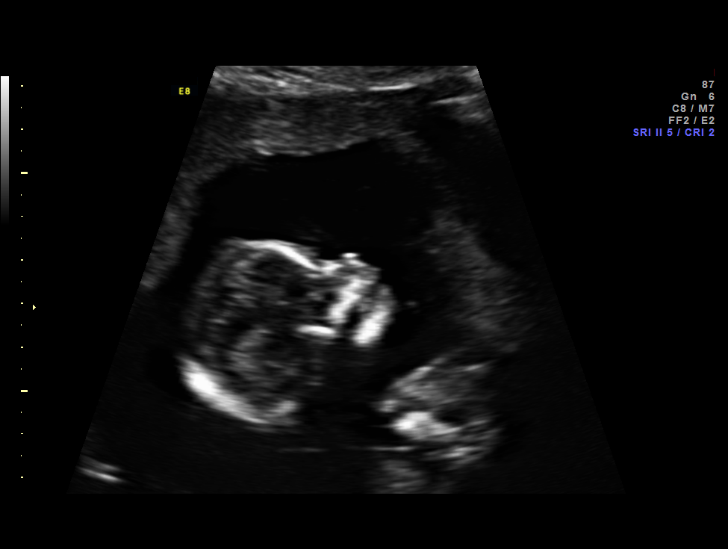

[13 of 28 positions shown; findings below may reference images not displayed]

OBSTETRICS REPORT
                      (Signed Final 11/09/2012 [DATE])

Service(s) Provided

 [HOSPITAL]                                         76815.0
Indications

 Twin gestation, Mareile
 Advanced maternal age (AMA), Multigravida (37yo
 at delivery); low risk NIPS
Fetal Evaluation (Fetus A)

 Num Of Fetuses:    2
 Fetal Heart Rate:  144                          bpm
 Cardiac Activity:  Observed
 Fetal Lie:         Lower right
 Presentation:      Breech
 Placenta:          Posterior, above cervical
                    os
 P. Cord            Previously Visualized
 Insertion:

 Membrane Desc:     Dividing
                    Membrane seen
                    - Monochorionic

 Amniotic Fluid
 AFI FV:      Subjectively within normal limits
                                             Larg Pckt:     4.1  cm
Gestational Age (Fetus A)

 LMP:           16w 0d        Date:  07/20/12                 EDD:   04/26/13
 Best:          16w 0d     Det. By:  LMP  (07/20/12)          EDD:   04/26/13

Fetal Evaluation (Fetus B)

 Num Of Fetuses:    2
 Fetal Heart Rate:  144                          bpm
 Cardiac Activity:  Observed
 Fetal Lie:         Upper left
 Presentation:      Breech
 Placenta:          Posterior, above cervical
                    os
 P. Cord            Previously Visualized
 Insertion:
 Membrane Desc:     Dividing
                    Membrane seen
                    - Monochorionic

 Amniotic Fluid
 AFI FV:      Subjectively within normal limits
                                             Larg Pckt:     4.4  cm
Gestational Age (Fetus B)

 LMP:           16w 0d        Date:  07/20/12                 EDD:   04/26/13
 Best:          16w 0d     Det. By:  LMP  (07/20/12)          EDD:   04/26/13
Cervix Uterus Adnexa

 Cervix:       Normal appearance by transabdominal scan. Appears
               closed, without funnelling.
Impression

 Monochorionic/diamniotic twin pregnancy at 16+0 weeks
 Normal amniotic fluid volume x 2
 Stomach and bladder visualized x 2
Recommendations

 Follow-up ultrasound in 2 weeks for detailed anatomic survey
 x 2

 questions or concerns.

## 2014-01-11 ENCOUNTER — Other Ambulatory Visit: Payer: Self-pay | Admitting: Obstetrics and Gynecology

## 2014-01-11 ENCOUNTER — Encounter: Payer: BC Managed Care – PPO | Attending: Obstetrics & Gynecology

## 2014-01-11 VITALS — Ht 65.0 in | Wt 201.1 lb

## 2014-01-11 DIAGNOSIS — Z713 Dietary counseling and surveillance: Secondary | ICD-10-CM | POA: Insufficient documentation

## 2014-01-11 DIAGNOSIS — O24410 Gestational diabetes mellitus in pregnancy, diet controlled: Secondary | ICD-10-CM | POA: Diagnosis not present

## 2014-01-12 NOTE — Progress Notes (Signed)
  Patient was seen on 01/11/14 for Gestational Diabetes self-management . The following learning objectives were met by the patient :   States the definition of Gestational Diabetes  States why dietary management is important in controlling blood glucose  Describes the effects of carbohydrates on blood glucose levels  Demonstrates ability to create a balanced meal plan  Demonstrates carbohydrate counting   States when to check blood glucose levels  Demonstrates proper blood glucose monitoring techniques  States the effect of stress and exercise on blood glucose levels  States the importance of limiting caffeine and abstaining from alcohol and smoking  Plan:  Aim for 2 Carb Choices per meal (30 grams) +/- 1 either way for breakfast Aim for 3 Carb Choices per meal (45 grams) +/- 1 either way from lunch and dinner Aim for 1-2 Carbs per snack Begin reading food labels for Total Carbohydrate and sugar grams of foods Consider  increasing your activity level by walking daily as tolerated Begin checking BG before breakfast and 2 hours after first bit of breakfast, lunch and dinner after  as directed by MD  Take medication  as directed by MD  Blood glucose monitor given: One Touch Ultra 2 Lot # [id number] X Exp: 07/2014 Blood glucose reading: 66m/dl  Patient instructed to monitor glucose levels: FBS: 60 - <90 2 hour: <120  Patient received the following handouts:  Nutrition Diabetes and Pregnancy  Carbohydrate Counting List  Meal Planning worksheet  Patient will be seen for follow-up as needed.

## 2014-01-27 ENCOUNTER — Other Ambulatory Visit: Payer: Self-pay | Admitting: Obstetrics & Gynecology

## 2014-01-31 IMAGING — CR DG ABD PORTABLE 1V
1 series · 1 of 1 positions shown · non-contrast
Comparison: None.

CLINICAL DATA: Unable to count sponges. Emergency cesarean section.
Foreign body search.

EXAM:
PORTABLE ABDOMEN - 1 VIEW

[view not recorded]
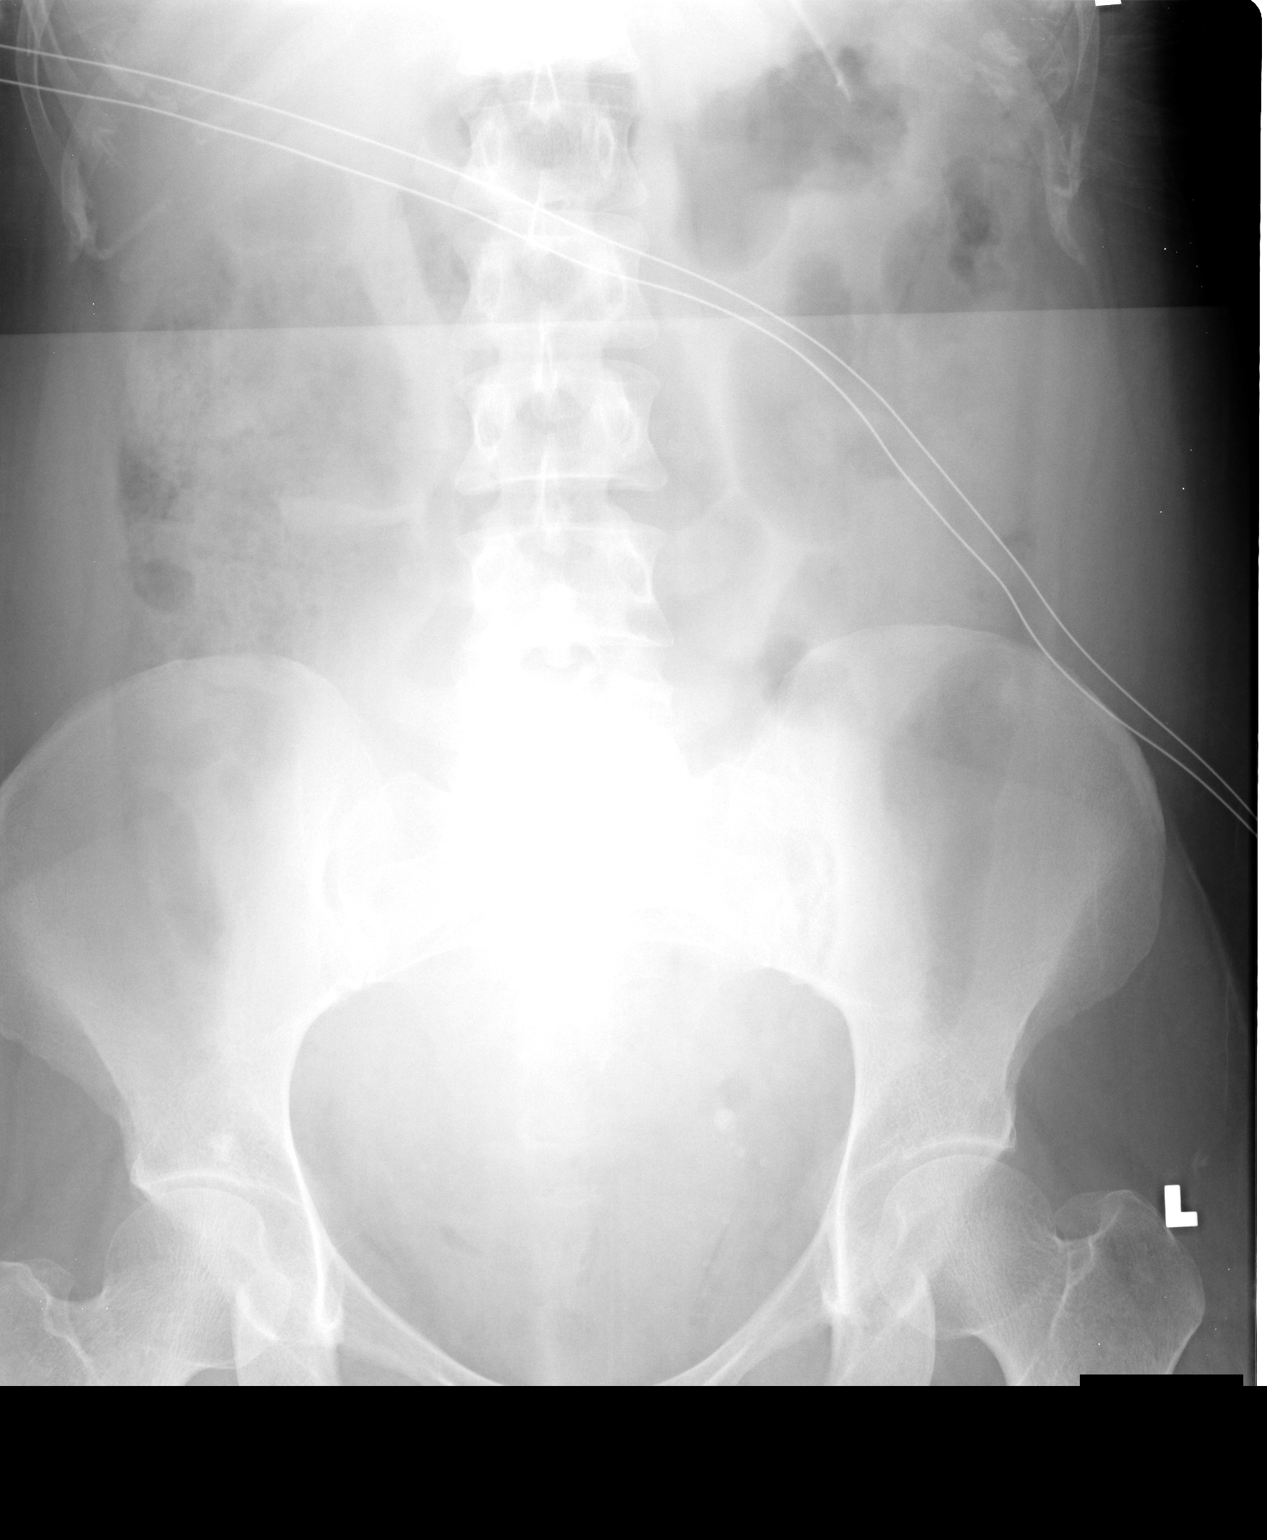

[1 of 1 positions shown; findings below may reference images not displayed]

FINDINGS: Linear density in the left upper quadrant probably representing a
nasogastric tube are similar, and is nowhere near where the surgery
would have taken place.

No unexpected foreign body observed.
IMPRESSION: 1. No unexpected foreign body observed.

I telephoned this result to [HOSPITAL] OR personnel at [DATE]
p.m. on 01/16/2013.

## 2014-02-15 ENCOUNTER — Encounter (HOSPITAL_COMMUNITY): Payer: Self-pay | Admitting: Anesthesiology

## 2014-02-15 ENCOUNTER — Encounter (HOSPITAL_COMMUNITY)
Admission: AD | Disposition: A | Discharge status: Home / Self Care | Payer: Self-pay | Source: Ambulatory Visit | Attending: Obstetrics

## 2014-02-15 ENCOUNTER — Inpatient Hospital Stay (HOSPITAL_COMMUNITY)
Admission: AD | Admit: 2014-02-15 | Discharge: 2014-02-18 | DRG: 766 | Disposition: A | Discharge status: Home / Self Care | Payer: BC Managed Care – PPO | Source: Ambulatory Visit | Attending: Obstetrics | Admitting: Obstetrics

## 2014-02-15 ENCOUNTER — Inpatient Hospital Stay (HOSPITAL_COMMUNITY): Payer: BC Managed Care – PPO | Admitting: Anesthesiology

## 2014-02-15 DIAGNOSIS — O24429 Gestational diabetes mellitus in childbirth, unspecified control: Secondary | ICD-10-CM | POA: Diagnosis present

## 2014-02-15 DIAGNOSIS — O3421 Maternal care for scar from previous cesarean delivery: Secondary | ICD-10-CM | POA: Diagnosis present

## 2014-02-15 DIAGNOSIS — O9902 Anemia complicating childbirth: Secondary | ICD-10-CM | POA: Diagnosis present

## 2014-02-15 DIAGNOSIS — Z3A37 37 weeks gestation of pregnancy: Secondary | ICD-10-CM | POA: Diagnosis present

## 2014-02-15 DIAGNOSIS — O34219 Maternal care for unspecified type scar from previous cesarean delivery: Secondary | ICD-10-CM

## 2014-02-15 DIAGNOSIS — D649 Anemia, unspecified: Secondary | ICD-10-CM | POA: Diagnosis present

## 2014-02-15 DIAGNOSIS — IMO0001 Reserved for inherently not codable concepts without codable children: Secondary | ICD-10-CM

## 2014-02-15 LAB — CBC
HCT: 31.9 % — ABNORMAL LOW (ref 36.0–46.0)
HEMATOCRIT: 27.3 % — AB (ref 36.0–46.0)
Hemoglobin: 10.6 g/dL — ABNORMAL LOW (ref 12.0–15.0)
Hemoglobin: 9 g/dL — ABNORMAL LOW (ref 12.0–15.0)
MCH: 30.5 pg (ref 26.0–34.0)
MCH: 30.3 pg (ref 26.0–34.0)
MCHC: 33.2 g/dL (ref 30.0–36.0)
MCHC: 33 g/dL (ref 30.0–36.0)
MCV: 91.7 fL (ref 78.0–100.0)
MCV: 91.9 fL (ref 78.0–100.0)
Platelets: 221 K/uL (ref 150–400)
Platelets: 178 10*3/uL (ref 150–400)
RBC: 3.48 MIL/uL — ABNORMAL LOW (ref 3.87–5.11)
RBC: 2.97 MIL/uL — ABNORMAL LOW (ref 3.87–5.11)
RDW: 14.9 % (ref 11.5–15.5)
RDW: 14.8 % (ref 11.5–15.5)
WBC: 7.7 K/uL (ref 4.0–10.5)
WBC: 10.4 10*3/uL (ref 4.0–10.5)

## 2014-02-15 LAB — GLUCOSE, CAPILLARY
Glucose-Capillary: 82 mg/dL (ref 70–99)
Glucose-Capillary: 103 mg/dL — ABNORMAL HIGH (ref 70–99)
Glucose-Capillary: 77 mg/dL (ref 70–99)
Glucose-Capillary: 99 mg/dL (ref 70–99)
Glucose-Capillary: 124 mg/dL — ABNORMAL HIGH (ref 70–99)

## 2014-02-15 LAB — TYPE AND SCREEN
ABO/RH(D): A POS
ANTIBODY SCREEN: NEGATIVE

## 2014-02-15 LAB — RPR

## 2014-02-15 SURGERY — Surgical Case
Anesthesia: Spinal | Site: Abdomen

## 2014-02-15 MED ORDER — ONDANSETRON HCL 4 MG/2ML IJ SOLN: 4.0000 mg | INTRAMUSCULAR | Status: DC | PRN

## 2014-02-15 MED ORDER — LACTATED RINGERS IV BOLUS (SEPSIS): 1000.0000 mL | Freq: Once | INTRAVENOUS | Status: DC

## 2014-02-15 MED ORDER — KETOROLAC TROMETHAMINE 30 MG/ML IJ SOLN: 30.0000 mg | Freq: Four times a day (QID) | INTRAMUSCULAR | Status: AC | PRN

## 2014-02-15 MED ORDER — CEFAZOLIN SODIUM-DEXTROSE 2-3 GM-% IV SOLR
2.0000 g | Freq: Once | INTRAVENOUS | Status: DC
  Filled 2014-02-15: qty 50

## 2014-02-15 MED ORDER — ONDANSETRON HCL 4 MG/2ML IJ SOLN: 4.0000 mg | Freq: Three times a day (TID) | INTRAMUSCULAR | Status: DC | PRN

## 2014-02-15 MED ORDER — DIPHENHYDRAMINE HCL 25 MG PO CAPS: 25.0000 mg | ORAL_CAPSULE | ORAL | Status: DC | PRN

## 2014-02-15 MED ORDER — DEXTROSE 5 % IV SOLN
1.0000 ug/kg/h | INTRAVENOUS | Status: DC | PRN
  Filled 2014-02-15: qty 2

## 2014-02-15 MED ORDER — NALBUPHINE HCL 10 MG/ML IJ SOLN
5.0000 mg | INTRAMUSCULAR | Status: DC | PRN
  Filled 2014-02-15: qty 1

## 2014-02-15 MED ORDER — PHENYLEPHRINE 8 MG IN D5W 100 ML (0.08MG/ML) PREMIX OPTIME
INJECTION | INTRAVENOUS | Status: DC | PRN
  Administered 2014-02-15: 60 ug/min via INTRAVENOUS

## 2014-02-15 MED ORDER — OXYCODONE-ACETAMINOPHEN 5-325 MG PO TABS
2.0000 | ORAL_TABLET | ORAL | Status: DC | PRN
  Administered 2014-02-15 – 2014-02-17 (×6): 2 via ORAL
  Filled 2014-02-15 (×7): qty 2

## 2014-02-15 MED ORDER — LACTATED RINGERS IV SOLN
INTRAVENOUS | Status: DC
  Administered 2014-02-15 (×4): via INTRAVENOUS

## 2014-02-15 MED ORDER — KETOROLAC TROMETHAMINE 30 MG/ML IJ SOLN
INTRAMUSCULAR | Status: AC
  Filled 2014-02-15: qty 1

## 2014-02-15 MED ORDER — LACTATED RINGERS IV SOLN: INTRAVENOUS | Status: DC | PRN

## 2014-02-15 MED ORDER — SCOPOLAMINE 1 MG/3DAYS TD PT72: 1.0000 | MEDICATED_PATCH | Freq: Once | TRANSDERMAL | Status: DC

## 2014-02-15 MED ORDER — SIMETHICONE 80 MG PO CHEW
80.0000 mg | CHEWABLE_TABLET | ORAL | Status: DC | PRN
  Administered 2014-02-16: 80 mg via ORAL

## 2014-02-15 MED ORDER — PRENATAL MULTIVITAMIN CH
1.0000 | ORAL_TABLET | Freq: Every day | ORAL | Status: DC
  Administered 2014-02-16 – 2014-02-17 (×2): 1 via ORAL
  Filled 2014-02-15 (×2): qty 1

## 2014-02-15 MED ORDER — LANOLIN HYDROUS EX OINT: 1.0000 "application " | TOPICAL_OINTMENT | CUTANEOUS | Status: DC | PRN

## 2014-02-15 MED ORDER — SENNOSIDES-DOCUSATE SODIUM 8.6-50 MG PO TABS
2.0000 | ORAL_TABLET | ORAL | Status: DC
  Administered 2014-02-15 – 2014-02-18 (×3): 2 via ORAL
  Filled 2014-02-15 (×3): qty 2

## 2014-02-15 MED ORDER — SODIUM CHLORIDE 0.9 % IJ SOLN: 3.0000 mL | INTRAMUSCULAR | Status: DC | PRN

## 2014-02-15 MED ORDER — NALBUPHINE HCL 10 MG/ML IJ SOLN
5.0000 mg | Freq: Once | INTRAMUSCULAR | Status: AC | PRN
  Administered 2014-02-15: 5 mg via SUBCUTANEOUS

## 2014-02-15 MED ORDER — MORPHINE SULFATE 0.5 MG/ML IJ SOLN
INTRAMUSCULAR | Status: AC
  Filled 2014-02-15: qty 10

## 2014-02-15 MED ORDER — DIPHENHYDRAMINE HCL 25 MG PO CAPS
25.0000 mg | ORAL_CAPSULE | Freq: Four times a day (QID) | ORAL | Status: DC | PRN
Start: 2014-02-15 — End: 2014-02-15

## 2014-02-15 MED ORDER — NALOXONE HCL 0.4 MG/ML IJ SOLN: 0.4000 mg | INTRAMUSCULAR | Status: DC | PRN

## 2014-02-15 MED ORDER — SIMETHICONE 80 MG PO CHEW
80.0000 mg | CHEWABLE_TABLET | Freq: Three times a day (TID) | ORAL | Status: DC
  Administered 2014-02-15 – 2014-02-18 (×7): 80 mg via ORAL
  Filled 2014-02-15 (×8): qty 1

## 2014-02-15 MED ORDER — CEFAZOLIN SODIUM-DEXTROSE 2-3 GM-% IV SOLR
INTRAVENOUS | Status: AC
  Filled 2014-02-15: qty 50

## 2014-02-15 MED ORDER — SIMETHICONE 80 MG PO CHEW
80.0000 mg | CHEWABLE_TABLET | ORAL | Status: DC
  Administered 2014-02-15 – 2014-02-18 (×3): 80 mg via ORAL
  Filled 2014-02-15 (×3): qty 1

## 2014-02-15 MED ORDER — FENTANYL CITRATE 0.05 MG/ML IJ SOLN
INTRAMUSCULAR | Status: AC
  Filled 2014-02-15: qty 2

## 2014-02-15 MED ORDER — BUPIVACAINE IN DEXTROSE 0.75-8.25 % IT SOLN
INTRATHECAL | Status: DC | PRN
  Administered 2014-02-15: 1.6 mL via INTRATHECAL

## 2014-02-15 MED ORDER — WITCH HAZEL-GLYCERIN EX PADS: 1.0000 "application " | MEDICATED_PAD | CUTANEOUS | Status: DC | PRN

## 2014-02-15 MED ORDER — DIPHENHYDRAMINE HCL 50 MG/ML IJ SOLN: 12.5000 mg | INTRAMUSCULAR | Status: DC | PRN

## 2014-02-15 MED ORDER — ONDANSETRON HCL 4 MG/2ML IJ SOLN
INTRAMUSCULAR | Status: AC
  Filled 2014-02-15: qty 2

## 2014-02-15 MED ORDER — SCOPOLAMINE 1 MG/3DAYS TD PT72
MEDICATED_PATCH | TRANSDERMAL | Status: DC | PRN
  Administered 2014-02-15: 1 via TRANSDERMAL

## 2014-02-15 MED ORDER — OXYTOCIN 40 UNITS IN LACTATED RINGERS INFUSION - SIMPLE MED: 62.5000 mL/h | INTRAVENOUS | Status: AC

## 2014-02-15 MED ORDER — FAMOTIDINE IN NACL 20-0.9 MG/50ML-% IV SOLN
20.0000 mg | Freq: Once | INTRAVENOUS | Status: AC
  Administered 2014-02-15: 20 mg via INTRAVENOUS
  Filled 2014-02-15: qty 50

## 2014-02-15 MED ORDER — OXYTOCIN 10 UNIT/ML IJ SOLN
INTRAMUSCULAR | Status: DC | PRN
  Administered 2014-02-15: 40 [IU] via INTRAMUSCULAR

## 2014-02-15 MED ORDER — KETOROLAC TROMETHAMINE 30 MG/ML IJ SOLN
30.0000 mg | Freq: Four times a day (QID) | INTRAMUSCULAR | Status: AC | PRN
  Administered 2014-02-15: 30 mg via INTRAVENOUS
  Filled 2014-02-15: qty 1

## 2014-02-15 MED ORDER — ONDANSETRON HCL 4 MG/2ML IJ SOLN
INTRAMUSCULAR | Status: DC | PRN
  Administered 2014-02-15: 4 mg via INTRAVENOUS

## 2014-02-15 MED ORDER — MEPERIDINE HCL 25 MG/ML IJ SOLN: 6.2500 mg | INTRAMUSCULAR | Status: DC | PRN

## 2014-02-15 MED ORDER — TERBUTALINE SULFATE 1 MG/ML IJ SOLN
0.2500 mg | Freq: Once | INTRAMUSCULAR | Status: AC
  Administered 2014-02-15: 0.25 mg via SUBCUTANEOUS

## 2014-02-15 MED ORDER — PHENYLEPHRINE 8 MG IN D5W 100 ML (0.08MG/ML) PREMIX OPTIME
INJECTION | INTRAVENOUS | Status: AC
  Filled 2014-02-15: qty 100

## 2014-02-15 MED ORDER — TETANUS-DIPHTH-ACELL PERTUSSIS 5-2.5-18.5 LF-MCG/0.5 IM SUSP
0.5000 mL | Freq: Once | INTRAMUSCULAR | Status: AC
  Administered 2014-02-16: 0.5 mL via INTRAMUSCULAR

## 2014-02-15 MED ORDER — OXYCODONE-ACETAMINOPHEN 5-325 MG PO TABS
1.0000 | ORAL_TABLET | ORAL | Status: DC | PRN
  Administered 2014-02-15 – 2014-02-17 (×2): 1 via ORAL
  Filled 2014-02-15: qty 1

## 2014-02-15 MED ORDER — FENTANYL CITRATE 0.05 MG/ML IJ SOLN: 25.0000 ug | INTRAMUSCULAR | Status: DC | PRN

## 2014-02-15 MED ORDER — MEPERIDINE HCL 25 MG/ML IJ SOLN
INTRAMUSCULAR | Status: AC
  Filled 2014-02-15: qty 1

## 2014-02-15 MED ORDER — IBUPROFEN 600 MG PO TABS
600.0000 mg | ORAL_TABLET | Freq: Four times a day (QID) | ORAL | Status: DC
  Administered 2014-02-15 – 2014-02-18 (×10): 600 mg via ORAL
  Filled 2014-02-15 (×10): qty 1

## 2014-02-15 MED ORDER — OXYTOCIN 10 UNIT/ML IJ SOLN
INTRAMUSCULAR | Status: AC
  Filled 2014-02-15: qty 4

## 2014-02-15 MED ORDER — MENTHOL 3 MG MT LOZG: 1.0000 | LOZENGE | OROMUCOSAL | Status: DC | PRN

## 2014-02-15 MED ORDER — LACTATED RINGERS IV SOLN
INTRAVENOUS | Status: DC | PRN
  Administered 2014-02-15 (×2): via INTRAVENOUS

## 2014-02-15 MED ORDER — SERTRALINE HCL 50 MG PO TABS
50.0000 mg | ORAL_TABLET | Freq: Every day | ORAL | Status: DC
  Administered 2014-02-15 – 2014-02-18 (×4): 50 mg via ORAL
  Filled 2014-02-15 (×4): qty 1

## 2014-02-15 MED ORDER — LACTATED RINGERS IV SOLN
INTRAVENOUS | Status: DC
  Administered 2014-02-15: 14:00:00 via INTRAVENOUS

## 2014-02-15 MED ORDER — DIBUCAINE 1 % RE OINT: 1.0000 "application " | TOPICAL_OINTMENT | RECTAL | Status: DC | PRN

## 2014-02-15 MED ORDER — NALBUPHINE HCL 10 MG/ML IJ SOLN: 5.0000 mg | INTRAMUSCULAR | Status: DC | PRN

## 2014-02-15 MED ORDER — EPHEDRINE 5 MG/ML INJ
INTRAVENOUS | Status: AC
  Filled 2014-02-15: qty 10

## 2014-02-15 MED ORDER — FENTANYL CITRATE 0.05 MG/ML IJ SOLN
INTRAMUSCULAR | Status: DC | PRN
  Administered 2014-02-15: 15 ug via INTRATHECAL

## 2014-02-15 MED ORDER — MORPHINE SULFATE (PF) 0.5 MG/ML IJ SOLN
INTRAMUSCULAR | Status: DC | PRN
  Administered 2014-02-15: .1 mg via INTRATHECAL

## 2014-02-15 MED ORDER — CEFAZOLIN SODIUM-DEXTROSE 2-3 GM-% IV SOLR
INTRAVENOUS | Status: DC | PRN
  Administered 2014-02-15: 2 g via INTRAVENOUS

## 2014-02-15 MED ORDER — PROMETHAZINE HCL 25 MG/ML IJ SOLN: 6.2500 mg | INTRAMUSCULAR | Status: DC | PRN

## 2014-02-15 MED ORDER — TERBUTALINE SULFATE 1 MG/ML IJ SOLN
INTRAMUSCULAR | Status: AC
  Filled 2014-02-15: qty 1

## 2014-02-15 MED ORDER — KETOROLAC TROMETHAMINE 30 MG/ML IJ SOLN
15.0000 mg | Freq: Once | INTRAMUSCULAR | Status: AC | PRN
  Administered 2014-02-15: 30 mg via INTRAVENOUS

## 2014-02-15 MED ORDER — NALBUPHINE HCL 10 MG/ML IJ SOLN: 5.0000 mg | Freq: Once | INTRAMUSCULAR | Status: AC | PRN

## 2014-02-15 MED ORDER — ONDANSETRON HCL 4 MG PO TABS: 4.0000 mg | ORAL_TABLET | ORAL | Status: DC | PRN

## 2014-02-15 MED ORDER — CITRIC ACID-SODIUM CITRATE 334-500 MG/5ML PO SOLN
30.0000 mL | Freq: Once | ORAL | Status: DC
Start: 2014-02-15 — End: 2014-02-15
  Filled 2014-02-15: qty 15

## 2014-02-15 MED ORDER — MEPERIDINE HCL 25 MG/ML IJ SOLN
INTRAMUSCULAR | Status: DC | PRN
  Administered 2014-02-15: 12.5 mg via INTRAVENOUS

## 2014-02-15 SURGICAL SUPPLY — 37 items
BENZOIN TINCTURE PRP APPL 2/3 (GAUZE/BANDAGES/DRESSINGS) ×3 IMPLANT
CLAMP CORD UMBIL (MISCELLANEOUS) IMPLANT
CLOSURE WOUND 1/2 X4 (GAUZE/BANDAGES/DRESSINGS) ×1
CLOTH BEACON ORANGE TIMEOUT ST (SAFETY) ×3 IMPLANT
DRAPE SHEET LG 3/4 BI-LAMINATE (DRAPES) IMPLANT
DRSG OPSITE POSTOP 4X10 (GAUZE/BANDAGES/DRESSINGS) ×3 IMPLANT
DURAPREP 26ML APPLICATOR (WOUND CARE) ×3 IMPLANT
ELECT REM PT RETURN 9FT ADLT (ELECTROSURGICAL) ×3
ELECTRODE REM PT RTRN 9FT ADLT (ELECTROSURGICAL) ×1 IMPLANT
EXTRACTOR VACUUM KIWI (MISCELLANEOUS) IMPLANT
GLOVE BIO SURGEON STRL SZ 6 (GLOVE) ×3 IMPLANT
GLOVE INDICATOR 6.0 STRL GRN (GLOVE) ×3 IMPLANT
GOWN STRL REUS W/TWL LRG LVL3 (GOWN DISPOSABLE) ×6 IMPLANT
KIT ABG SYR 3ML LUER SLIP (SYRINGE) IMPLANT
LIQUID BAND (GAUZE/BANDAGES/DRESSINGS) IMPLANT
NEEDLE HYPO 25X5/8 SAFETYGLIDE (NEEDLE) IMPLANT
NS IRRIG 1000ML POUR BTL (IV SOLUTION) ×3 IMPLANT
PACK C SECTION WH (CUSTOM PROCEDURE TRAY) ×3 IMPLANT
PAD ABD 8X7 1/2 STERILE (GAUZE/BANDAGES/DRESSINGS) ×3 IMPLANT
PAD OB MATERNITY 4.3X12.25 (PERSONAL CARE ITEMS) ×3 IMPLANT
RTRCTR C-SECT PINK 25CM LRG (MISCELLANEOUS) ×3 IMPLANT
SPONGE GAUZE 4X4 12PLY STER LF (GAUZE/BANDAGES/DRESSINGS) ×3 IMPLANT
SPONGE LAP 18X18 X RAY DECT (DISPOSABLE) ×6 IMPLANT
STRIP CLOSURE SKIN 1/2X4 (GAUZE/BANDAGES/DRESSINGS) ×2 IMPLANT
SUT MNCRL 0 VIOLET CTX 36 (SUTURE) ×2 IMPLANT
SUT MNCRL AB 3-0 PS2 27 (SUTURE) ×3 IMPLANT
SUT MONOCRYL 0 CTX 36 (SUTURE) ×4
SUT PLAIN 0 NONE (SUTURE) IMPLANT
SUT PLAIN 2 0 (SUTURE) ×2
SUT PLAIN ABS 2-0 CT1 27XMFL (SUTURE) ×1 IMPLANT
SUT VIC AB 0 CTX 36 (SUTURE) ×4
SUT VIC AB 0 CTX36XBRD ANBCTRL (SUTURE) ×2 IMPLANT
SUT VIC AB 2-0 CT1 27 (SUTURE)
SUT VIC AB 2-0 CT1 TAPERPNT 27 (SUTURE) IMPLANT
TOWEL OR 17X24 6PK STRL BLUE (TOWEL DISPOSABLE) ×3 IMPLANT
TRAY FOLEY CATH 14FR (SET/KITS/TRAYS/PACK) ×3 IMPLANT
WATER STERILE IRR 1000ML POUR (IV SOLUTION) ×3 IMPLANT

## 2014-02-15 NOTE — Transfer of Care (Signed)
Immediate Anesthesia Transfer of Care Note  Patient: [name] Green  Procedure(s) Performed: Procedure(s): CESAREAN SECTION (N/A)  Patient Location: PACU  Anesthesia Type:Spinal  Level of Consciousness: awake, alert , oriented and patient cooperative  Airway & Oxygen Therapy: Patient Spontanous Breathing  Post-op Assessment: Report given to PACU RN and Post -op Vital signs reviewed and stable  Post vital signs: Reviewed and stable  Complications: No apparent anesthesia complications

## 2014-02-15 NOTE — H&P (Signed)
38 y.o. [id number] @ 8334w5d presents with labor.  She has a h/o of a prior low vertical c/s with her twin pregnancy and is scheduled for a RCS.  Otherwise has good fetal movement and no bleeding.  Pregnancy c/b 1. GDM: on glyburide 2.5/5mg .  Last growth on 11/2 3028g (>90%, AC >97%) 2. H/o PTD: 25 wks twins 12/2012, received 17-P this pregnancy 3. AMA: NIPT low risk  Past Medical History  Diagnosis Date  . Gestational diabetes mellitus, antepartum     Past Surgical History  Procedure Laterality Date  . Cesarean section N/A 01/16/2013    Procedure: PRIMARY CESAREAN SECTION;  Surgeon: Allie BossierMyra C Dove, MD;  Location: [facility];  Service: Obstetrics;  Laterality: N/A;  Classical incision    OB History  Gravida Para Term Preterm AB SAB TAB Ectopic Multiple Living  4 1 0 1 2 0 2 0 1 2     # Outcome Date GA Lbr Len/2nd Weight Sex Delivery Anes PTL Lv  4 Current           3A Preterm 01/16/13 7647w5d  0.9 kg (1 lb 15.8 oz) F CS-LTranv Gen  Y  3B Preterm 01/16/13 3947w5d   F CS-LTranv Gen  Y  2 TAB           1 TAB               History   Social History  . Marital Status: Married    Spouse Name: N/A    Number of Children: N/A  . Years of Education: N/A   Occupational History  . Not on file.   Social History Main Topics  . Smoking status: Never Smoker   . Smokeless tobacco: Not on file  . Alcohol Use: Not on file  . Drug Use: Not on file  . Sexual Activity: Yes    Birth Control/ Protection: None   Other Topics Concern  . Not on file   Social History Narrative   Review of patient's allergies indicates no known allergies.    Prenatal Transfer Tool  Maternal Diabetes: Yes:  Diabetes Type:  Insulin/Medication controlled Genetic Screening: Normal Maternal Ultrasounds/Referrals: Normal Fetal Ultrasounds or other Referrals:  None Maternal Substance Abuse:  No Significant Maternal Medications:  Meds include: Progesterone, glyburid Significant Maternal Lab Results: Lab values include: Group B  Strep positive      Filed Vitals:   02/15/14 0400  BP: 123/78  Pulse: 101  Temp: 97.5 F (36.4 C)  Resp: 18     General:  Painful w contractions Abdomen:  soft, gravid, EFW 8# Ex:  tr edema SVE:  7cm per RN FHTs:  130s, mod var Cat 1 Toco:  q2-3 min   A/P   38 y.o. 5434w5d  [id number] presents with labor H/o prior low vertical cesarean delivery at 25 wks.  Consented for RCS.  Reviewed r/b/a including risk of infection, bleeding, need for blood tarnsfusion, damage to surrounding structures including bowel, bladder, tubes, ovaries, baby, vte, need for additional procedures.   GDM: on glyburide.  BG 82 on admission FSR/ GBS positive  [place] [place], [facility]CDYANNA

## 2014-02-15 NOTE — Progress Notes (Signed)
Subjective: Postpartum Day 0: Cesarean Delivery Patient reports pain controlled, no nausea or vomiting. Foley still in.  Objective: Vital signs in last 24 hours: Temp:  [97.3 F (36.3 C)-98.6 F (37 C)] 98.3 F (36.8 C) (11/25 1930) Pulse Rate:  [79-110] 85 (11/25 1930) Resp:  [11-33] 16 (11/25 1930) BP: (97-126)/(57-79) 111/71 mmHg (11/25 1930) SpO2:  [95 %-100 %] 100 % (11/25 1930) Weight:  [90.719 kg (200 lb)] 90.719 kg (200 lb) (11/25 0424)  Physical Exam:  General: alert, cooperative and appears stated age Lochia: appropriate Uterine Fundus: firm Incision: dressing intact DVT Evaluation: No evidence of DVT seen on physical exam.   Recent Labs  02/15/14 0420 02/15/14 0656  HGB 10.6* 9.0*  HCT 31.9* 27.3*    Assessment/Plan: Status post Cesarean section. Doing well postoperatively.  Continue current care.  Breane Grunwald H. 02/15/2014, 10:41 PM

## 2014-02-15 NOTE — Op Note (Signed)
Cesarean Section Procedure Note  Pre-operative Diagnosis: 1. Intrauterine pregnancy at 8221w5d  2. Active Labor  3. Prior low vertical cesarean section  Post-operative Diagnosis: same as above  Surgeon: Marlow Baarsyanna Merrissa Giacobbe, MD  Assistants: none  Procedure: Repeat low transverse cesarean section   Anesthesia: Spinal anesthesia  Estimated Blood Loss: 1400 mL         Drains: Foley catheter         Specimens: placenta to pathology         Implants: none         Complications:  None; patient tolerated the procedure well.         Disposition: PACU - hemodynamically stable.  Findings:  Normal uterus, tubes and ovaries bilaterally.  Viable Female infant, weight pending, Apgars 9, 9.    Procedure Details   The patient was counseled about the risks, benefits, complications of the cesarean section.  After spinal anesthesia was found to adequate , the patient was placed in the dorsal supine position with a leftward tilt, draped and prepped in the usual sterile manner. A Pfannenstiel incision was made through the prior scar and carried down through the subcutaneous tissue to the fascia.  The fascia was incised in the midline and the fascial incision was extended laterally with Mayo scissors. The superior aspect of the fascial incision was grasped with two Kocher clamp, tented up and the rectus muscles dissected off sharply. The rectus was then dissected off with blunt dissection and Mayo scissors inferiorly. The midline of the rectus muscles was identified and separated with a Reyah clamp. There was noted to be moderate adhesive disease of the anterior abdominal wall.  Sharp dissection was used to reach and identify the peritoneum. The abdominal peritoneum was identified, tented up, entered sharply, and the incision was extended superiorly and inferiorly with sharp dissection.  The vesicouterine peritoneum was adhered to the prior low vertical scar.  Sharp dissection was used to create a bladder flap.  The  Alexis retractor was deployed  Scalpel was then used to make a low transverse incision on the thin lower uterine segment, which was extended laterally with blunt dissection. The fluid was clear. The fetal vertex was identified and brought to the hysterotomy.  At this time, the skin incision was extended to allow delivery of the fetal head followed by the body.  The cord was clamped and cut and the infant was passed to the waiting neonatologist.  Placenta was then delivered spontaneously, intact and appear normal, the uterus was cleared of all clot and debris.   The hysterotomy was repaired with #0 Monocryl in running locked fashion.  A second imbricating layer was placed with #0 Monocryl due to bleeding from the thin lower uterine segment.  The portion of the left and right fallopian tubes were sent to pathology.  The bladder edge was away from the hysterotomy.  The urine was clear.  The hysterotomy was reexamined and excellent hemostasis was noted.   The Alexis retractor was removed from the abdomen. The abdominal cavity was cleared of all clot and debris. The fascia and rectus muscles were inspected and were hemostatic. The fascia was closed with 0 Vicryl in a running fashion. The subcuticular layer was irrigated and all bleeders cauterized.  The subcutaneous layer was re approximated with interrupted 3-0 plain gut.  The skin was closed with 3-0 monocryl in a subcuticular fashion. The incision was dressed with benzoine, steri strips and pressure dressing. All sponge lap and needle counts were correct  x3. Patient tolerated the procedure well and recovered in stable condition following the procedure.

## 2014-02-15 NOTE — Anesthesia Procedure Notes (Signed)
Spinal Patient location during procedure: OR Start time: 02/15/2014 5:19 AM Staffing Anesthesiologist: CASSIDY, AMY Performed by: anesthesiologist  Preanesthetic Checklist Completed: patient identified, site marked, surgical consent, pre-op evaluation, timeout performed, IV checked, risks and benefits discussed and monitors and equipment checked Spinal Block Patient position: sitting Prep: site prepped and draped and DuraPrep Patient monitoring: heart rate, cardiac monitor, continuous pulse ox and blood pressure Approach: midline Location: L3-4 Injection technique: single-shot Needle Needle type: Pencan  Needle gauge: 24 G Needle length: 9 cm Assessment Sensory level: T4 Additional Notes Clear free flow CSF on first attempt.  No paresthesia.  Patient tolerated procedure well with no apparent complications.  Jasmine DecemberA. Cassidy, MD

## 2014-02-15 NOTE — Anesthesia Preprocedure Evaluation (Signed)
Anesthesia Evaluation  Patient identified by MRN, date of birth, ID band Patient awake    Reviewed: Allergy & Precautions, H&P , NPO status , Patient's Chart, lab work & pertinent test results, reviewed documented beta blocker date and time   History of Anesthesia Complications Negative for: history of anesthetic complications  Airway Mallampati: III  TM Distance: >3 FB Neck ROM: full    Dental  (+) Teeth Intact   Pulmonary neg pulmonary ROS,  breath sounds clear to auscultation        Cardiovascular negative cardio ROS  Rhythm:regular Rate:Normal     Neuro/Psych negative neurological ROS  negative psych ROS   GI/Hepatic negative GI ROS, Neg liver ROS,   Endo/Other  diabetes, Gestational, Oral Hypoglycemic Agents  Renal/GU negative Renal ROS     Musculoskeletal   Abdominal   Peds  Hematology  (+) anemia ,   Anesthesia Other Findings NPO since 8 pm  Reproductive/Obstetrics (+) Pregnancy (prior classical C/S, labor --> repeat C/S)                             Anesthesia Physical Anesthesia Plan  ASA: II and emergent  Anesthesia Plan: Spinal   Post-op Pain Management:    Induction:   Airway Management Planned:   Additional Equipment:   Intra-op Plan:   Post-operative Plan:   Informed Consent: I have reviewed the patients History and Physical, chart, labs and discussed the procedure including the risks, benefits and alternatives for the proposed anesthesia with the patient or authorized representative who has indicated his/her understanding and acceptance.     Plan Discussed with: Surgeon and CRNA  Anesthesia Plan Comments:         Anesthesia Quick Evaluation

## 2014-02-15 NOTE — Anesthesia Postprocedure Evaluation (Signed)
  Anesthesia Post-op Note  Patient: [name] Green  Procedure(s) Performed: Procedure(s): CESAREAN SECTION (N/A)  Patient Location: PACU  Anesthesia Type:Spinal  Level of Consciousness: awake, alert  and oriented  Airway and Oxygen Therapy: Patient Spontanous Breathing  Post-op Pain: none  Post-op Assessment: Post-op Vital signs reviewed, Patient's Cardiovascular Status Stable, Respiratory Function Stable, Patent Airway, No signs of Nausea or vomiting, Pain level controlled, No headache and No backache  Post-op Vital Signs: Reviewed and stable  Last Vitals:  Filed Vitals:   02/15/14 0745  BP: 113/63  Pulse: 95  Temp:   Resp: 13    Complications: No apparent anesthesia complications

## 2014-02-15 NOTE — Brief Op Note (Signed)
02/15/2014  6:23 AM  PATIENT:  [name] Green  38 y.o. female  PRE-OPERATIVE DIAGNOSIS:  Repeat Ceserean section; labor  POST-OPERATIVE DIAGNOSIS:  Repeat Ceserean section; labor  PROCEDURE:  Procedure(s): CESAREAN SECTION (N/A)  SURGEON:  Surgeon(s) and Role:    * Marlow Baarsyanna Xochilt Conant, MD - Primary   ANESTHESIA:   spinal  EBL:  Total I/O In: 3000 [I.V.:3000] Out: 1475 [Urine:75; Blood:1400]  BLOOD ADMINISTERED:none  DRAINS: none   LOCAL MEDICATIONS USED:  NONE  SPECIMEN:  No Specimen  DISPOSITION OF SPECIMEN:  N/A  COUNTS:  YES  TOURNIQUET:  * No tourniquets in log *  DICTATION: .Note written in EPIC  PLAN OF CARE: Admit to inpatient   PATIENT DISPOSITION:  PACU - hemodynamically stable.   Delay start of Pharmacological VTE agent (>24hrs) due to surgical blood loss or risk of bleeding: not applicable

## 2014-02-15 NOTE — Anesthesia Postprocedure Evaluation (Signed)
Anesthesia Post Note  Patient: [name] Green  Procedure(s) Performed: Procedure(s) (LRB): CESAREAN SECTION (N/A)  Anesthesia type: Spinal  Patient location: Mother/Baby  Post pain: Pain level controlled  Post assessment: Post-op Vital signs reviewed  Last Vitals:  Filed Vitals:   02/15/14 1351  BP: 112/70  Pulse: 86  Temp: 37 C  Resp: 18    Post vital signs: Reviewed  Level of consciousness: awake  Complications: No apparent anesthesia complications

## 2014-02-15 NOTE — Addendum Note (Signed)
Addendum  created 02/15/14 1551 by Graciela HusbandsWynn O Amaya Blakeman, CRNA   Modules edited: Notes Section   Notes Section:  File: [id number]

## 2014-02-15 NOTE — Plan of Care (Signed)
Problem: Phase I Progression Outcomes Goal: Pain controlled with appropriate interventions Outcome: Completed/Met Date Met:  02/15/14 Goal: Foley catheter patent Outcome: Completed/Met Date Met:  02/15/14 Goal: OOB as tolerated unless otherwise ordered Outcome: Completed/Met Date Met:  02/15/14 Goal: IS, TCDB as ordered Outcome: Completed/Met Date Met:  02/15/14 Goal: VS, stable, temp < 100.4 degrees F Outcome: Completed/Met Date Met:  02/15/14

## 2014-02-15 NOTE — MAU Note (Signed)
Pt c/o ctx since 0030 tonight. No bleeding some mucusy discharge

## 2014-02-15 NOTE — Lactation Note (Signed)
This note was copied from the chart of [name] Green Nielsen. Lactation Consultation Note  Patient Name: [name] Green Clontz ZOXWR'UToday's Date: 02/15/2014 Reason for consult: Follow-up assessment Baby 10 hours of life. Mom [facility] but needed someone to see baby latch. Mom is holding baby in cross-cradle position and has just finished nursing for 7 minutes. Enc mom to unwrap baby and attempt to latch again. Baby latches deeply, suckling rhythmically with a few swallows noted. Mom is able to easily hand express colostrum. Demonstrated to mom how to stimulate baby to keep nursing. Discussed differences between pumping and bottle-feeding with twins and nursing this baby. Enc mom to offer lots of STS and to nurse with cues, and to call out for assistance with latching as needed.   Maternal Data Has patient been taught Hand Expression?: Yes Does the patient have breastfeeding experience prior to this delivery?: Yes  Feeding Feeding Type: Breast Fed Length of feed:  (LC assess 15 minutes of BF.)  LATCH Score/Interventions Latch: Grasps breast easily, tongue down, lips flanged, rhythmical sucking. Intervention(s): Adjust position;Breast compression  Audible Swallowing: A few with stimulation Intervention(s): Skin to skin;Hand expression  Type of Nipple: Everted at rest and after stimulation  Comfort (Breast/Nipple): Soft / non-tender     Hold (Positioning): Assistance needed to correctly position infant at breast and maintain latch.  LATCH Score: 8  Lactation Tools Discussed/Used     Consult Status Consult Status: Follow-up Date: 02/16/14 Follow-up type: In-patient    Geralynn OchsWILLIARD, Mulki Roesler 02/15/2014, 5:35 PM

## 2014-02-15 NOTE — Lactation Note (Signed)
This note was copied from the chart of [name] Green Pressey. Lactation Consultation Note  Patient Name: [name] Green Leppert WUJWJ'XToday's Date: 02/15/2014 Reason for consult: Initial assessment Baby is 8 hours old and has been to the breast several times. Per mom the latch has been comfortable. LC reviewed basics - breast massage, hand express, latch , And breast compressions with latch, and intermittent. Per mom baby recently had breast  Fed for 10 mins. LC  Discussed latch scoring and the importance of calling for LC to do a latch assessment, also MBU RN could  Assess latch score. Mother informed of post-discharge support and given phone number to the lactation department,  including services for phone call assistance; out-patient appointments; and breastfeeding support group. List of other  breastfeeding resources in the community given in the handout. Encouraged mother to call for problems or concerns related to breastfeeding.   Maternal Data Does the patient have breastfeeding experience prior to this delivery?: Yes  Feeding Feeding Type:  (per mom recently fed at 1325 for 10 mins , and releaased ) Length of feed: 10 min  LATCH Score/Interventions                      Lactation Tools Discussed/Used     Consult Status Consult Status: Follow-up Date: 02/15/14 (for latch assessment ) Follow-up type: In-patient    Kathrin Greathouseorio, Charle Clear Ann 02/15/2014, 1:55 PM

## 2014-02-16 ENCOUNTER — Encounter (HOSPITAL_COMMUNITY): Payer: Self-pay | Admitting: Obstetrics

## 2014-02-16 LAB — CBC
HEMATOCRIT: 23 % — AB (ref 36.0–46.0)
HEMOGLOBIN: 7.6 g/dL — AB (ref 12.0–15.0)
MCH: 30.5 pg (ref 26.0–34.0)
MCHC: 33 g/dL (ref 30.0–36.0)
MCV: 92.4 fL (ref 78.0–100.0)
Platelets: 158 10*3/uL (ref 150–400)
RBC: 2.49 MIL/uL — ABNORMAL LOW (ref 3.87–5.11)
RDW: 15.2 % (ref 11.5–15.5)
WBC: 7.3 10*3/uL (ref 4.0–10.5)

## 2014-02-16 LAB — GLUCOSE, CAPILLARY
GLUCOSE-CAPILLARY: 82 mg/dL (ref 70–99)
Glucose-Capillary: 89 mg/dL (ref 70–99)
Glucose-Capillary: 79 mg/dL (ref 70–99)

## 2014-02-16 NOTE — Plan of Care (Signed)
Problem: Phase II Progression Outcomes Goal: Afebrile, VS remain stable Outcome: Completed/Met Date Met:  02/16/14 Goal: Incision intact & without signs/symptoms of infection Outcome: Completed/Met Date Met:  02/16/14  Problem: Discharge Progression Outcomes Goal: Barriers To Progression Addressed/Resolved Outcome: Completed/Met Date Met:  02/16/14 Goal: Activity appropriate for discharge plan Outcome: Completed/Met Date Met:  02/16/14 Goal: Tolerating diet Outcome: Completed/Met Date Met:  92/95/74 Goal: Complications resolved/controlled Outcome: Completed/Met Date Met:  02/16/14 Goal: Pain controlled with appropriate interventions Outcome: Completed/Met Date Met:  02/16/14

## 2014-02-16 NOTE — Plan of Care (Signed)
Problem: Phase II Progression Outcomes Goal: Tolerating diet Outcome: Completed/Met Date Met:  02/16/14

## 2014-02-16 NOTE — Plan of Care (Signed)
Problem: Phase II Progression Outcomes Goal: Pain controlled on oral analgesia Outcome: Completed/Met Date Met:  02/16/14

## 2014-02-16 NOTE — Plan of Care (Signed)
Problem: Discharge Progression Outcomes Goal: Afebrile, VS remain stable at discharge Outcome: Completed/Met Date Met:  02/16/14     

## 2014-02-16 NOTE — Plan of Care (Signed)
Problem: Phase II Progression Outcomes Goal: Progress activity as tolerated unless otherwise ordered Outcome: Completed/Met Date Met:  02/16/14     

## 2014-02-16 NOTE — Progress Notes (Signed)
Subjective: Postpartum Day 1: Cesarean Delivery Patient reports incisional pain, tolerating PO, + flatus and no problems voiding.    Objective: Vital signs in last 24 hours: Temp:  [97.3 F (36.3 C)-98.6 F (37 C)] 98.2 F (36.8 C) (11/26 0507) Pulse Rate:  [79-100] 95 (11/26 0507) Resp:  [16-20] 18 (11/26 0507) BP: (107-123)/(58-71) 123/70 mmHg (11/26 0507) SpO2:  [95 %-100 %] 96 % (11/26 0507)  Physical Exam:  General: alert, cooperative and no distress Lochia: appropriate Uterine Fundus: firm Incision: healing well, no significant drainage, no dehiscence, no significant erythema DVT Evaluation: No evidence of DVT seen on physical exam. Negative Homan's sign. No cords or calf tenderness.   Recent Labs  02/15/14 0656 02/16/14 0550  HGB 9.0* 7.6*  HCT 27.3* 23.0*    Assessment/Plan: Status post Cesarean section. Doing well postoperatively.  Encourage ambulation and incentive spirometry. Possible D/C tomorrow Circumcision done  Wynonia HazardINN, Nicolis Boody STACIA 02/16/2014, 9:39 AM

## 2014-02-16 NOTE — Plan of Care (Signed)
Problem: Phase I Progression Outcomes Goal: Voiding adequately Outcome: Completed/Met Date Met:  02/16/14 Goal: Initial discharge plan identified Outcome: Completed/Met Date Met:  02/16/14 Goal: Other Phase I Outcomes/Goals Outcome: Completed/Met Date Met:  02/16/14

## 2014-02-16 NOTE — Lactation Note (Signed)
This note was copied from the chart of [name] Heinz KnucklesKelly Selvy. Lactation Consultation Note  Follow up visit made.  Mom states she would like latch checked.  Assisted with positioning baby in football hold skin to skin.  Instructed on using enough support under baby and good breast support during feeding.  Baby opens wide and latches easily.  Bottom lip untucked.  Reminded mom to keep baby close during feeding.  Baby nursed actively for 15 minutes with good swallows.  Encouraged to feed with any feeding cue and cluster feeds discussed.  Answered mom's questions.  Patient Name: [name] Green XBJYN'WToday's Date: 02/16/2014 Reason for consult: Follow-up assessment   Maternal Data    Feeding Feeding Type: Breast Fed Length of feed: 15 min  LATCH Score/Interventions Latch: Grasps breast easily, tongue down, lips flanged, rhythmical sucking. Intervention(s): Adjust position;Assist with latch;Breast massage;Breast compression  Audible Swallowing: A few with stimulation Intervention(s): Skin to skin  Type of Nipple: Everted at rest and after stimulation  Comfort (Breast/Nipple): Soft / non-tender     Hold (Positioning): Assistance needed to correctly position infant at breast and maintain latch. Intervention(s): Breastfeeding basics reviewed;Support Pillows;Position options;Skin to skin  LATCH Score: 8  Lactation Tools Discussed/Used     Consult Status Consult Status: Follow-up Date: 02/17/14 Follow-up type: In-patient    Huston FoleyMOULDEN, Jacilyn Sanpedro S 02/16/2014, 4:52 PM

## 2014-02-17 LAB — GLUCOSE, CAPILLARY: GLUCOSE-CAPILLARY: 68 mg/dL — AB (ref 70–99)

## 2014-02-17 MED ORDER — FERROUS SULFATE 325 (65 FE) MG PO TABS
325.0000 mg | ORAL_TABLET | Freq: Two times a day (BID) | ORAL | Status: DC
  Administered 2014-02-17 – 2014-02-18 (×2): 325 mg via ORAL
  Filled 2014-02-17 (×2): qty 1

## 2014-02-17 MED ORDER — DOCUSATE SODIUM 100 MG PO CAPS
100.0000 mg | ORAL_CAPSULE | Freq: Two times a day (BID) | ORAL | Status: DC
  Administered 2014-02-17 – 2014-02-18 (×2): 100 mg via ORAL
  Filled 2014-02-17 (×2): qty 1

## 2014-02-17 MED ORDER — DOCUSATE SODIUM 50 MG/5ML PO LIQD
100.0000 mg | Freq: Two times a day (BID) | ORAL | Status: DC
Start: 2014-02-17 — End: 2014-02-17
  Administered 2014-02-17: 100 mg via ORAL
  Filled 2014-02-17 (×3): qty 10

## 2014-02-17 NOTE — Progress Notes (Addendum)
Patient is eating, ambulating, voiding.  Pain control is good.  + flatus.  Appropriate lochia.  No complaints but tired.  Filed Vitals:   02/16/14 0005 02/16/14 0507 02/16/14 1722 02/17/14 0546  BP: 121/65 123/70 133/99 118/62  Pulse: 87 95 90 86  Temp: 98.2 F (36.8 C) 98.2 F (36.8 C) 98.7 F (37.1 C) 98.5 F (36.9 C)  TempSrc: Oral Oral Oral Oral  Resp: 16 18 18 18   Height:      Weight:      SpO2: 100% 96%      Fundus firm Inc: c/d/i Ext: no CT  Lab Results  Component Value Date   WBC 7.3 02/16/2014   HGB 7.6* 02/16/2014   HCT 23.0* 02/16/2014   MCV 92.4 02/16/2014   PLT 158 02/16/2014    --/--/A POS (11/25 0420)  A/P Post op day #2 s/p repeat c/s. Doing well, would like to stay until tomorrow. Anemia- FeSO4 and colace.  Routine care.    Philip AspenALLAHAN, Wells Mabe

## 2014-02-17 NOTE — Plan of Care (Signed)
Problem: Phase II Progression Outcomes Goal: Other Phase II Outcomes/Goals Outcome: Not Applicable Date Met:  89/79/15  Problem: Discharge Progression Outcomes Goal: MMR given as ordered Outcome: Not Applicable Date Met:  07/05/62

## 2014-02-17 NOTE — Lactation Note (Signed)
This note was copied from the chart of [name] Green. Lactation Consultation Note  Patient Name: [name] Green ZOXWR'UToday's Date: 02/17/2014 Reason for consult: Follow-up assessment  Mom's milk is coming in.  Mom's R nipple w/a mild "V"-shaped compression stripe.  L nipple w/very mild scabbing on tip.  Nipples are somewhat pink, but overall atraumatic.  Specifics of an asymmetric latch shown via [employer] website animation. Mom desires that I return to observe baby feed.  Mom has my # to call.   Lurline HareRichey, Cola Highfill [facility] 02/17/2014, 11:55 AM

## 2014-02-18 MED ORDER — OXYCODONE-ACETAMINOPHEN 5-325 MG PO TABS: 2.0000 | ORAL_TABLET | ORAL | Status: AC | PRN

## 2014-02-18 NOTE — Lactation Note (Signed)
This note was copied from the chart of [name] Green Olejnik. Lactation Consultation Note     Follow up consult with this mom and baby, now 6875 hours old, and now 538 1/[redacted] weeks gestation corrected. Mom's milk is in, and easily expressed and flowing. Mom has been leaning forward and placing her nipple in baby's mouth, hence shehas sore nipples - mom given comfort gels and instructed in their use. I helped mom to position for football hold, and to bring the baby to her, and she obtained a deep latch, with a wide flanged mouth. i showed mom what a good latch looks and feel like, and explained why this si important for both her and the baby. Basic breast feeding teaching done from the baby and me book and kactation services reviewed. Mom encouraged to call for questions , and to come in for o/p consult as needed.  Patient Name: [name] Green Bellin WUJWJ'XToday's Date: 02/18/2014 Reason for consult: Follow-up assessment   Maternal Data    Feeding Feeding Type: Breast Fed Length of feed: 10 min  LATCH Score/Interventions Latch: Grasps breast easily, tongue down, lips flanged, rhythmical sucking. Intervention(s): Adjust position;Assist with latch;Breast massage;Breast compression  Audible Swallowing: Spontaneous and intermittent Intervention(s): Skin to skin;Hand expression  Type of Nipple: Everted at rest and after stimulation  Comfort (Breast/Nipple): Filling, red/small blisters or bruises, mild/mod discomfort  Problem noted: Mild/Moderate discomfort Interventions (Mild/moderate discomfort): Comfort gels  Hold (Positioning): Assistance needed to correctly position infant at breast and maintain latch. Intervention(s): Breastfeeding basics reviewed;Support Pillows;Position options;Skin to skin  LATCH Score: 8  Lactation Tools Discussed/Used     Consult Status Consult Status: Complete Follow-up type: Call as needed    Alfred LevinsLee, Dulcey Riederer Anne 02/18/2014, 9:12 AM

## 2014-02-18 NOTE — Plan of Care (Signed)
Problem: Consults Goal: Postpartum Patient Education (See Patient Education module for education specifics.)  Outcome: Completed/Met Date Met:  02/18/14 Goal: Diabetes Guidelines if Diabetic/Glucose > 140 If diabetic or lab glucose is > 140 mg/dl - Initiate Diabetes/Hyperglycemia Guidelines & Document Interventions  Outcome: Not Applicable Date Met:  05/05/15  Problem: Discharge Progression Outcomes Goal: Discharge plan in place and appropriate Outcome: Completed/Met Date Met:  02/18/14 Goal: Other Discharge Outcomes/Goals Outcome: Not Applicable Date Met:  35/67/01

## 2014-02-18 NOTE — Discharge Summary (Signed)
Obstetric Discharge Summary Reason for Admission: onset of labor and cesarean section Prenatal Procedures: ultrasound Intrapartum Procedures: cesarean: low cervical, transverse Postpartum Procedures: none Complications-Operative and Postpartum: none HEMOGLOBIN  Date Value Ref Range Status  02/16/2014 7.6* 12.0 - 15.0 g/dL Final   HCT  Date Value Ref Range Status  02/16/2014 23.0* 36.0 - 46.0 % Final    Physical Exam:  General: alert and cooperative Lochia: appropriate Uterine Fundus: firm Incision: healing well, no significant drainage, no significant erythema DVT Evaluation: No evidence of DVT seen on physical exam.  Discharge Diagnoses: Term Pregnancy-delivered  Discharge Information: Date: 02/18/2014 Activity: pelvic rest Diet: routine Medications: PNV, Ibuprofen, Colace, Iron and Percocet Condition: stable Instructions: refer to practice specific booklet Discharge to: home Follow-up Information    Follow up with [name] Green In 2 weeks.   Specialty:  Obstetrics   Contact information:   [address] [place] [address] [phone]       Newborn Data: Live born female  Birth Weight: 8 lb 11.5 oz (3955 g) APGAR: 9, 9  Home with mother.  [name] Green 02/18/2014, 7:46 AM

## 2014-02-28 ENCOUNTER — Encounter (HOSPITAL_COMMUNITY): Admission: RE | Payer: Self-pay | Source: Ambulatory Visit

## 2014-02-28 ENCOUNTER — Inpatient Hospital Stay (HOSPITAL_COMMUNITY)
Admission: RE | Admit: 2014-02-28 | Payer: BC Managed Care – PPO | Source: Ambulatory Visit | Admitting: Obstetrics and Gynecology

## 2014-02-28 SURGERY — Surgical Case
Anesthesia: Regional

## 2014-03-29 ENCOUNTER — Encounter (HOSPITAL_COMMUNITY): Payer: Self-pay | Admitting: Emergency Medicine

## 2014-03-29 ENCOUNTER — Emergency Department (HOSPITAL_COMMUNITY)
Admission: EM | Admit: 2014-03-29 | Discharge: 2014-03-29 | Disposition: A | Discharge status: Home / Self Care | Payer: BC Managed Care – PPO | Source: Home / Self Care | Attending: Family Medicine | Admitting: Family Medicine

## 2014-03-29 DIAGNOSIS — K529 Noninfective gastroenteritis and colitis, unspecified: Secondary | ICD-10-CM

## 2014-03-29 MED ORDER — ONDANSETRON HCL 4 MG PO TABS: 4.0000 mg | ORAL_TABLET | Freq: Four times a day (QID) | ORAL | Status: AC

## 2014-03-29 MED ORDER — ONDANSETRON 4 MG PO TBDP
8.0000 mg | ORAL_TABLET | Freq: Once | ORAL | Status: AC
  Administered 2014-03-29: 8 mg via ORAL

## 2014-03-29 MED ORDER — ONDANSETRON 4 MG PO TBDP
ORAL_TABLET | ORAL | Status: AC
  Filled 2014-03-29: qty 2

## 2014-03-29 NOTE — Discharge Instructions (Signed)
Clear liquid , bland diet tonight as tolerated, advance on thurs as improved, use medicine as needed, imodium for diarrhea,  return or see your doctor if any problems.

## 2014-03-29 NOTE — ED Provider Notes (Signed)
CSN: [id number]     Arrival date & time 03/29/14  1601 History   First MD Initiated Contact with Patient 03/29/14 1627     Chief Complaint  Patient presents with  . Fever  . Emesis   (Consider location/radiation/quality/duration/timing/severity/associated sxs/prior Treatment) Patient is a 39 y.o. female presenting with fever. The history is provided by the patient.  Fever Max temp prior to arrival:  101.9 Temp source:  Oral Severity:  Moderate Onset quality:  Sudden Duration:  8 hours Progression:  Improving Chronicity:  New Associated symptoms: diarrhea, nausea and vomiting   Associated symptoms: no dysuria and no rash   Risk factors: no sick contacts   Risk factors comment:  6wk postpartum. has had 2 post c section checks  doing well. otherwise.   Past Medical History  Diagnosis Date  . Gestational diabetes mellitus, antepartum    Past Surgical History  Procedure Laterality Date  . Cesarean section N/A 01/16/2013    Procedure: PRIMARY CESAREAN SECTION;  Surgeon: Allie Bossier, MD;  Location: [facility];  Service: Obstetrics;  Laterality: N/A;  Classical incision  . Cesarean section N/A 02/15/2014    Procedure: CESAREAN SECTION;  Surgeon: Marlow Baars, MD;  Location: [facility];  Service: Obstetrics;  Laterality: N/A;   Family History  Problem Relation Age of Onset  . Cancer Other   . Hypertension Other    History  Substance Use Topics  . Smoking status: Never Smoker   . Smokeless tobacco: Not on file  . Alcohol Use: Yes     Comment: occassional   OB History    Gravida Para Term Preterm AB TAB SAB Ectopic Multiple Living   0 0 1 3     Review of Systems  Constitutional: Positive for fever.  Gastrointestinal: Positive for nausea, vomiting and diarrhea.  Genitourinary: Negative for dysuria.  Skin: Negative for rash.    Allergies  Review of patient's allergies indicates no known allergies.  Home Medications   Prior to Admission medications   Medication  Sig Start Date End Date Taking? Authorizing Provider  ondansetron (ZOFRAN) 4 MG tablet Take 1 tablet (4 mg total) by mouth every 6 (six) hours. Prn n/v. 03/29/14   Linna Hoff, MD  oxyCODONE-acetaminophen (PERCOCET/ROXICET) 5-325 MG per tablet Take 2 tablets by mouth every 4 (four) hours as needed (for pain scale equal to or greater than 7). 02/18/14   Philip Aspen, DO  sertraline (ZOLOFT) 50 MG tablet Take 75 mg by mouth daily.     Historical Provider, MD   BP 123/81 mmHg  Pulse 101  Temp(Src) 98.2 F (36.8 C) (Oral)  Resp 16  SpO2 97% Physical Exam  Constitutional: She is oriented to person, place, and time. She appears well-developed and well-nourished. No distress.  Neck: Normal range of motion. Neck supple.  Cardiovascular: Normal rate, regular rhythm, normal heart sounds and intact distal pulses.   Pulmonary/Chest: Effort normal and breath sounds normal.  Abdominal: Soft. Bowel sounds are normal. She exhibits no distension and no mass. There is no tenderness. There is no rebound and no guarding.  Genitourinary:  Nl c section scar, nontender, sl vag bleeding.  Neurological: She is alert and oriented to person, place, and time.  Skin: Skin is warm and dry.  Nursing note and vitals reviewed.   ED Course  Procedures (including critical care time) Labs Review Labs Reviewed - No data to display  Imaging Review No results found.   MDM  1. Gastroenteritis/enteritis        Linna HoffJames D Francelia Mclaren, MD 03/29/14 [phone]

## 2014-03-29 NOTE — ED Notes (Signed)
Pt states that she has had a fever and emesis today temp was 101.9 per pt at 1500. Pt states that she took 1000 mg of tylenol at 3pm to reduce fever

## 2015-05-24 ENCOUNTER — Other Ambulatory Visit: Payer: Self-pay | Admitting: Obstetrics

## 2015-05-25 LAB — CYTOLOGY - PAP

## 2019-01-03 ENCOUNTER — Other Ambulatory Visit: Payer: Self-pay

## 2019-01-03 DIAGNOSIS — Z20822 Contact with and (suspected) exposure to covid-19: Secondary | ICD-10-CM

## 2019-01-05 LAB — NOVEL CORONAVIRUS, NAA: SARS-CoV-2, NAA: NOT DETECTED

## 2023-12-03 ENCOUNTER — Other Ambulatory Visit: Payer: Self-pay | Admitting: Obstetrics

## 2023-12-03 DIAGNOSIS — R928 Other abnormal and inconclusive findings on diagnostic imaging of breast: Secondary | ICD-10-CM

## 2023-12-09 ENCOUNTER — Ambulatory Visit
Admission: RE | Admit: 2023-12-09 | Discharge: 2023-12-09 | Disposition: A | Discharge status: Home / Self Care | Payer: Self-pay | Source: Ambulatory Visit | Attending: Obstetrics | Admitting: Obstetrics

## 2023-12-09 ENCOUNTER — Ambulatory Visit
Admission: RE | Admit: 2023-12-09 | Discharge: 2023-12-09 | Disposition: A | Discharge status: Home / Self Care | Payer: Self-pay | Source: Ambulatory Visit | Attending: Obstetrics

## 2023-12-09 DIAGNOSIS — R928 Other abnormal and inconclusive findings on diagnostic imaging of breast: Secondary | ICD-10-CM

## 2024-02-21 ENCOUNTER — Ambulatory Visit: Admission: EM | Admit: 2024-02-21 | Discharge: 2024-02-21 | Disposition: A | Discharge status: Home / Self Care

## 2024-02-21 ENCOUNTER — Other Ambulatory Visit: Payer: Self-pay

## 2024-02-21 ENCOUNTER — Ambulatory Visit (HOSPITAL_BASED_OUTPATIENT_CLINIC_OR_DEPARTMENT_OTHER)
Admission: RE | Admit: 2024-02-21 | Discharge: 2024-02-21 | Disposition: A | Source: Ambulatory Visit | Attending: Internal Medicine

## 2024-02-21 DIAGNOSIS — M79605 Pain in left leg: Secondary | ICD-10-CM | POA: Insufficient documentation

## 2024-02-21 NOTE — Discharge Instructions (Signed)
 Please go to the imaging center at [facility] to have ultrasound completed.  I will call you with the results.

## 2024-02-21 NOTE — ED Provider Notes (Incomplete)
 [name] Green    CSN: [id number] Arrival date & time: 02/21/24  1143      History   Chief Complaint Chief Complaint  Patient presents with   Leg Pain    HPI [name] Green is a 48 y.o. female.   Patient presents with 2-day history of posterior left knee pain.  Reports that it woke her up in the middle of the night.  It has been an intermittent pain.  She became concerned today given that she started having numbness and tingling in her foot, and the pain started to radiate down her calf.  Has taken Advil  for symptoms.  Denies injury.  She denies any prolonged sitting or standing on a daily basis.  She denies that she smokes cigarettes.   Leg Pain   Past Medical History:  Diagnosis Date   Gestational diabetes mellitus, antepartum     Patient Active Problem List   Diagnosis Date Noted   Active labor at term 02/15/2014    Past Surgical History:  Procedure Laterality Date   CESAREAN SECTION N/A 01/16/2013   Procedure: PRIMARY CESAREAN SECTION;  Surgeon: Harland JAYSON Birkenhead, MD;  Location: [facility];  Service: Obstetrics;  Laterality: N/A;  Classical incision   CESAREAN SECTION N/A 02/15/2014   Procedure: CESAREAN SECTION;  Surgeon: Jolene Gaskins, MD;  Location: [facility];  Service: Obstetrics;  Laterality: N/A;    OB History     Gravida  4   Para  2   Term  1   Preterm  1   AB  2   Living  3      SAB  0   IAB  2   Ectopic  0   Multiple  1   Live Births  3            Home Medications    Prior to Admission medications   Medication Sig Start Date End Date Taking? Authorizing Provider  norethindrone-ethinyl estradiol-iron (ESTROSTEP FE) 1-20/1-30/1-35 MG-MCG tablet Take 1 tablet by mouth daily.   Yes [provider]  ondansetron  (ZOFRAN ) 4 MG tablet Take 1 tablet (4 mg total) by mouth every 6 (six) hours. Prn n/v. 03/29/14   Vincente Lynwood BIRCH, MD  oxyCODONE -acetaminophen  (PERCOCET/ROXICET) 5-325 MG per tablet Take 2 tablets by mouth every 4  (four) hours as needed (for pain scale equal to or greater than 7). 02/18/14   Lilton Legions, DO  sertraline  (ZOLOFT ) 50 MG tablet Take 75 mg by mouth daily.     [provider]    Family History Family History  Problem Relation Age of Onset   Cancer Other    Hypertension Other     Social History Social History   Tobacco Use   Smoking status: Never  Substance Use Topics   Alcohol use: Yes    Comment: occassional     Allergies   Patient has no known allergies.   Review of Systems Review of Systems Per HPI  Physical Exam Triage Vital Signs ED Triage Vitals  Encounter Vitals Group     BP 02/21/24 1204 133/76     Girls Systolic BP Percentile --      Girls Diastolic BP Percentile --      Boys Systolic BP Percentile --      Boys Diastolic BP Percentile --      Pulse Rate 02/21/24 1204 80     Resp 02/21/24 1204 16     Temp 02/21/24 1204 98.2 F (36.8 C)  Temp src --      SpO2 02/21/24 1204 92 %     Weight --      Height --      Head Circumference --      Peak Flow --      Pain Score 02/21/24 1207 8     Pain Loc --      Pain Education --      Exclude from Growth Chart --    No data found.  Updated Vital Signs BP 133/76   Pulse 80   Temp 98.2 F (36.8 C)   Resp 16   LMP 01/16/2024 (Exact Date)   SpO2 92%   Breastfeeding No   Visual Acuity Right Eye Distance:   Left Eye Distance:   Bilateral Distance:    Right Eye Near:   Left Eye Near:    Bilateral Near:     Physical Exam Constitutional:      General: She is not in acute distress.    Appearance: Normal appearance. She is not toxic-appearing or diaphoretic.  HENT:     Head: Normocephalic and atraumatic.  Eyes:     Extraocular Movements: Extraocular movements intact.     Conjunctiva/sclera: Conjunctivae normal.  Pulmonary:     Effort: Pulmonary effort is normal.  Musculoskeletal:     Comments: There is no tenderness to palpation throughout the left lower leg.  No significant  swelling or discoloration.  No lacerations or abrasions noted.  Patient reports pain is to posterior left knee.  Pedal pulses and capillary refill intact.  Patient ambulates without difficulty.  Neurological:     General: No focal deficit present.     Mental Status: She is alert and oriented to person, place, and time. Mental status is at baseline.  Psychiatric:        Mood and Affect: Mood normal.        Behavior: Behavior normal.        Thought Content: Thought content normal.        Judgment: Judgment normal.      UC Treatments / Results  Labs (all labs ordered are listed, but only abnormal results are displayed) Labs Reviewed - No data to display  EKG   Radiology US  Venous Img Lower Unilateral Left (DVT) Result Date: 02/21/2024 CLINICAL DATA:  Lower extremity pain EXAM: LEFT LOWER EXTREMITY VENOUS DOPPLER ULTRASOUND TECHNIQUE: Gray-scale sonography with compression, as well as color and duplex ultrasound, were performed to evaluate the deep venous system(s) from the level of the common femoral vein through the popliteal and proximal calf veins. COMPARISON:  None Available. FINDINGS: VENOUS Normal compressibility of the common femoral, superficial femoral, and popliteal veins, as well as the visualized calf veins. Visualized portions of profunda femoral vein and great saphenous vein unremarkable. No filling defects to suggest DVT on grayscale or color Doppler imaging. Doppler waveforms show normal direction of venous flow, normal respiratory plasticity and response to augmentation. Limited views of the contralateral common femoral vein are unremarkable. OTHER None. Limitations: none IMPRESSION: Negative. Electronically Signed   By: Greig Pique M.D.   On: 02/21/2024 15:06    Procedures Procedures (including critical Green time)  Medications Ordered in UC Medications - No data to display  Initial Impression / Assessment and Plan / UC Course  I have reviewed the triage vital signs  and the nursing notes.  Pertinent labs & imaging results that were available during my Green of the patient were reviewed by me and considered in my  medical decision making (see chart for details).     DVT ultrasound completed that was negative. Given no injury or concern for bony abnormality on physical exam, x-ray imaging was deferred. Suspect possible muscular inflammation that could be causing pressure/inflammation on surrounding nerves. Advised patient to take OTC analgesics and rest. She was advised to follow up with orthopedist at provided contact information as well. She is neurologically intact with no other deficits so I have no concern for worrisome etiology such as CVA. Advised strict return and ER precautions. Patient verbalized understanding and was agreeable with plan. Spo2 was rechecked and was 97 percent.  Final Clinical Impressions(s) / UC Diagnoses   Final diagnoses:  Left leg pain     Discharge Instructions      Please go to the imaging center at [facility] to have ultrasound completed.  I will call you with the results.    ED Prescriptions   None    PDMP not reviewed this encounter.   Hazen Darryle BRAVO, [place] 02/22/24 0858    Hazen Darryle BRAVO, FNP 02/22/24 0859    Hazen Darryle BRAVO, FNP 02/22/24 0900

## 2024-02-21 NOTE — ED Triage Notes (Signed)
 Has c/o left leg pain and numbness. Pain x 2 days. Numbness since this morning. Has had advil  yesterday.

## 2024-02-23 ENCOUNTER — Telehealth: Payer: Self-pay | Admitting: Internal Medicine

## 2024-02-23 ENCOUNTER — Ambulatory Visit: Payer: Self-pay | Admitting: Internal Medicine

## 2024-02-23 NOTE — Telephone Encounter (Signed)
 Attempted to call patient to check on her.  There was no answer.  Unable to leave voicemail.
